# Patient Record
Sex: Female | Born: 1960 | Race: White | Hispanic: No | Marital: Single | Smoking: Never smoker
Health system: Southern US, Community
[De-identification: ages and names within clinical notes are randomized; demographics above are authoritative.]

## PROBLEM LIST (undated history)

## (undated) DIAGNOSIS — N83209 Unspecified ovarian cyst, unspecified side: Secondary | ICD-10-CM

## (undated) DIAGNOSIS — M797 Fibromyalgia: Secondary | ICD-10-CM

## (undated) DIAGNOSIS — F329 Major depressive disorder, single episode, unspecified: Secondary | ICD-10-CM

## (undated) DIAGNOSIS — N926 Irregular menstruation, unspecified: Secondary | ICD-10-CM

## (undated) DIAGNOSIS — R928 Other abnormal and inconclusive findings on diagnostic imaging of breast: Secondary | ICD-10-CM

## (undated) DIAGNOSIS — H9190 Unspecified hearing loss, unspecified ear: Secondary | ICD-10-CM

## (undated) DIAGNOSIS — G43909 Migraine, unspecified, not intractable, without status migrainosus: Secondary | ICD-10-CM

## (undated) DIAGNOSIS — N319 Neuromuscular dysfunction of bladder, unspecified: Secondary | ICD-10-CM

## (undated) DIAGNOSIS — N189 Chronic kidney disease, unspecified: Secondary | ICD-10-CM

## (undated) DIAGNOSIS — R12 Heartburn: Secondary | ICD-10-CM

## (undated) DIAGNOSIS — J40 Bronchitis, not specified as acute or chronic: Secondary | ICD-10-CM

## (undated) DIAGNOSIS — M8580 Other specified disorders of bone density and structure, unspecified site: Secondary | ICD-10-CM

## (undated) DIAGNOSIS — IMO0002 Reserved for concepts with insufficient information to code with codable children: Secondary | ICD-10-CM

## (undated) DIAGNOSIS — I519 Heart disease, unspecified: Secondary | ICD-10-CM

## (undated) DIAGNOSIS — F32A Depression, unspecified: Secondary | ICD-10-CM

## (undated) DIAGNOSIS — J449 Chronic obstructive pulmonary disease, unspecified: Secondary | ICD-10-CM

## (undated) DIAGNOSIS — M1990 Unspecified osteoarthritis, unspecified site: Secondary | ICD-10-CM

## (undated) DIAGNOSIS — F419 Anxiety disorder, unspecified: Secondary | ICD-10-CM

## (undated) DIAGNOSIS — I8390 Asymptomatic varicose veins of unspecified lower extremity: Secondary | ICD-10-CM

## (undated) DIAGNOSIS — K219 Gastro-esophageal reflux disease without esophagitis: Secondary | ICD-10-CM

## (undated) HISTORY — DX: Bronchitis, not specified as acute or chronic: J40

## (undated) HISTORY — DX: Unspecified hearing loss, unspecified ear: H91.90

## (undated) HISTORY — DX: Chronic kidney disease, unspecified: N18.9

## (undated) HISTORY — DX: Asymptomatic varicose veins of unspecified lower extremity: I83.90

## (undated) HISTORY — DX: Heart disease, unspecified: I51.9

## (undated) HISTORY — DX: Reserved for concepts with insufficient information to code with codable children: IMO0002

## (undated) HISTORY — DX: Gastro-esophageal reflux disease without esophagitis: K21.9

## (undated) HISTORY — PX: INNER EAR SURGERY: SHX679

## (undated) HISTORY — PX: TUBAL LIGATION: SHX77

## (undated) HISTORY — DX: Anxiety disorder, unspecified: F41.9

## (undated) HISTORY — DX: Irregular menstruation, unspecified: N92.6

## (undated) HISTORY — DX: Chronic obstructive pulmonary disease, unspecified: J44.9

## (undated) HISTORY — PX: NASAL SINUS SURGERY: SHX719

## (undated) HISTORY — DX: Unspecified osteoarthritis, unspecified site: M19.90

## (undated) HISTORY — DX: Unspecified ovarian cyst, unspecified side: N83.209

## (undated) HISTORY — DX: Major depressive disorder, single episode, unspecified: F32.9

## (undated) HISTORY — DX: Migraine, unspecified, not intractable, without status migrainosus: G43.909

## (undated) HISTORY — DX: Depression, unspecified: F32.A

## (undated) HISTORY — DX: Heartburn: R12

## (undated) HISTORY — DX: Other specified disorders of bone density and structure, unspecified site: M85.80

## (undated) HISTORY — PX: BRAIN MENINGIOMA EXCISION: SHX576

---

## 1998-11-02 ENCOUNTER — Other Ambulatory Visit: Admission: RE | Admit: 1998-11-02 | Discharge: 1998-11-02 | Payer: Self-pay | Admitting: Family Medicine

## 1999-03-22 ENCOUNTER — Other Ambulatory Visit: Admission: RE | Admit: 1999-03-22 | Discharge: 1999-03-22 | Payer: Self-pay | Admitting: Obstetrics and Gynecology

## 1999-03-24 ENCOUNTER — Ambulatory Visit (HOSPITAL_COMMUNITY): Admission: RE | Admit: 1999-03-24 | Discharge: 1999-03-24 | Payer: Self-pay | Admitting: Obstetrics and Gynecology

## 1999-03-24 ENCOUNTER — Encounter: Payer: Self-pay | Admitting: Obstetrics and Gynecology

## 2009-03-12 HISTORY — PX: CERVICAL FUSION: SHX112

## 2009-12-02 ENCOUNTER — Inpatient Hospital Stay (HOSPITAL_COMMUNITY): Admission: RE | Admit: 2009-12-02 | Discharge: 2009-12-03 | Payer: Self-pay | Admitting: Neurosurgery

## 2010-05-25 LAB — BASIC METABOLIC PANEL
BUN: 7 mg/dL (ref 6–23)
CO2: 29 mEq/L (ref 19–32)
Calcium: 9.6 mg/dL (ref 8.4–10.5)
Chloride: 105 mEq/L (ref 96–112)
Creatinine, Ser: 0.68 mg/dL (ref 0.4–1.2)
GFR calc Af Amer: >60 mL/min (ref >60)
GFR calc non Af Amer: >60 mL/min (ref >60)
Glucose, Bld: 86 mg/dL (ref 70–99)
Potassium: 4.7 mEq/L (ref 3.5–5.1)
Sodium: 139 mEq/L (ref 135–145)

## 2010-05-25 LAB — CBC
HCT: 40 % (ref 36.0–46.0)
Hemoglobin: 13.2 g/dL (ref 12.0–15.0)
MCH: 27.4 pg (ref 26.0–34.0)
MCHC: 33 g/dL (ref 30.0–36.0)
MCV: 83.2 fL (ref 78.0–100.0)
Platelets: 298 10*3/uL (ref 150–400)
RBC: 4.81 MIL/uL (ref 3.87–5.11)
RDW: 13.1 % (ref 11.5–15.5)
WBC: 18 10*3/uL — ABNORMAL HIGH (ref 4.0–10.5)

## 2010-05-25 LAB — SURGICAL PCR SCREEN
MRSA, PCR: NEGATIVE
Staphylococcus aureus: NEGATIVE

## 2011-02-05 ENCOUNTER — Encounter: Payer: Self-pay | Admitting: Vascular Surgery

## 2011-02-08 ENCOUNTER — Other Ambulatory Visit: Payer: Self-pay

## 2011-02-08 DIAGNOSIS — R600 Localized edema: Secondary | ICD-10-CM

## 2011-02-16 ENCOUNTER — Encounter: Payer: Self-pay | Admitting: Vascular Surgery

## 2011-02-19 ENCOUNTER — Ambulatory Visit (INDEPENDENT_AMBULATORY_CARE_PROVIDER_SITE_OTHER): Payer: BC Managed Care – PPO

## 2011-02-19 ENCOUNTER — Encounter: Payer: Self-pay | Admitting: Vascular Surgery

## 2011-02-19 ENCOUNTER — Ambulatory Visit (INDEPENDENT_AMBULATORY_CARE_PROVIDER_SITE_OTHER): Payer: BC Managed Care – PPO | Admitting: Vascular Surgery

## 2011-02-19 VITALS — BP 140/76 | HR 80 | Resp 20 | Ht 65.0 in | Wt 165.0 lb

## 2011-02-19 DIAGNOSIS — M79609 Pain in unspecified limb: Secondary | ICD-10-CM

## 2011-02-19 DIAGNOSIS — M7989 Other specified soft tissue disorders: Secondary | ICD-10-CM

## 2011-02-19 DIAGNOSIS — I83893 Varicose veins of bilateral lower extremities with other complications: Secondary | ICD-10-CM

## 2011-02-19 NOTE — Progress Notes (Signed)
Subjective:     Patient ID: Vanessa Mayer, female   DOB: 1960-05-12, 50 y.o.   MRN: [id number]  HPI this 50 year old female was referred for evaluation of possible venous insufficiency of the left leg the patient complains of swelling in the outside part of the left leg from the knee to the ankle as well as numbness this has been present for several months. She denies a history of DVT, thrombophlebitis, stasis ulcers, bleeding varicosities appear she is not wear elastic compression stockings nor elevate her legs at night. She states that sometimes the ankle swells as the day progresses. She was evaluated by I nerve specialist recently who said that the numbness in the leg was probably due to vascular causes. She does not ambulate long distances because of generalized fatigue but not leg pain.  Past Medical History  Diagnosis Date  . Chronic kidney disease   . Heartburn   . Depression   . Arthritis   . Anxiety   . Bronchitis   . Varicose veins   . Irregular menstrual cycle   . Chronic sinusitis   . Deaf     left ear  . Cyst     base of spine  . Ovarian cyst     History  Substance Use Topics  . Smoking status: Never Smoker   . Smokeless tobacco: Not on file  . Alcohol Use:     Family History  Problem Relation Age of Onset  . Diabetes Mother   . Hypertension Mother   . Cancer Father     lung  . Kidney disease Father     No Known Allergies  Current outpatient prescriptions:bethanechol (URECHOLINE) 25 MG tablet, Take 25 mg by mouth 3 (three) times daily.  , Disp: , Rfl: ;  cyclobenzaprine (FLEXERIL) 10 MG tablet, Take 10 mg by mouth 2 (two) times daily as needed.  , Disp: , Rfl: ;  DULoxetine (CYMBALTA) 60 MG capsule, Take 60 mg by mouth daily.  , Disp: , Rfl: ;  esomeprazole (NEXIUM) 40 MG capsule, Take 40 mg by mouth daily before breakfast.  , Disp: , Rfl:  loratadine (CLARITIN) 10 MG tablet, Take 10 mg by mouth daily.  , Disp: , Rfl: ;  meloxicam (MOBIC) 7.5 MG tablet, Take  7.5 mg by mouth 2 (two) times daily.  , Disp: , Rfl: ;  Multiple Vitamin (MULTIVITAMIN) capsule, Take 1 capsule by mouth daily.  , Disp: , Rfl: ;  omeprazole (PRILOSEC) 20 MG capsule, Take 20 mg by mouth daily.  , Disp: , Rfl: ;  pregabalin (LYRICA) 100 MG capsule, Take 100 mg by mouth 3 (three) times daily.  , Disp: , Rfl:  tiotropium (SPIRIVA) 18 MCG inhalation capsule, Place 18 mcg into inhaler and inhale daily.  , Disp: , Rfl: ;  medroxyPROGESTERone (PROVERA) 10 MG tablet, Take 10 mg by mouth daily.  , Disp: , Rfl:   BP 140/76  Pulse 80  Resp 20  Ht 5\' 5"  (1.651 m)  Wt 165 lb (74.844 kg)  BMI 27.46 kg/m2  Body mass index is 27.46 kg/(m^2).        Review of Systems she complains of dyspnea on exertion, leg pain with walking, swelling in legs, wheezing, tingling in the arms and legs, history of depression, but denies chest pain or any other specific symptoms in the complete review of    Objective:   Physical Exam blood pressure 140/76 heart rate 80 respirations 20 General she is a well-developed well-nourished  female in no apparent distress HEENT normal for age Lungs no rhonchi or wheezing Cardiovascular regular rhythm no murmurs carotid pulses 3+ no audible bruits Abdomen soft nontender with no masses Skin free of rashes Neurologic exam normal Lower extremity exam reveals 3+ femoral and dorsalis pedis pulses bilaterally. She does have multiple spider veins in the left leg anteriorly and posteriorly the distal thigh and proximal calf posteriorly. There are no bulging varicosities. There is no hyperpigmentation, ulceration, or significant edema in either lower extremity today.  Today I ordered a venous duplex exam which I reviewed and interpreted. There is significant reflux in the deep system of the left leg. There is no consistent reflux in the great saphenous or small saphenous systems of the left leg. The great saphenous vein is not of large caliber. There is one incompetent  perforating branch in the distal left leg.     Assessment:    deep venous insufficiency left leg with valvular incompetence but no evidence of superficial venous disease accounting for her symptomatology Doubt that the numbness in the lateral aspect of the left leg has any relationship with her vasculature    Plan:     #1 elevate foot of bed while sleeping at night 2-3 inches #2 short leg elastic compression stockings #3 further evaluation if numbness persists by orthopedic or neurologic type surgeon for possible nerve compression syndrome

## 2011-02-26 NOTE — Procedures (Unsigned)
LOWER EXTREMITY VENOUS REFLUX EXAM  INDICATION:  Left lower extremity edema and varicose veins.  EXAM:  Using color-flow imaging and pulse Doppler spectral analysis, the left common femoral, superficial femoral, popliteal, posterior tibial, greater and lesser saphenous veins are evaluated.  There is no evidence suggesting deep venous insufficiency in the left lower extremity.  The left saphenofemoral junction is competent. The left GSV is competent with the caliber as described below.  There are multiple incompetent pelvic branches in the left  groin. There is an incompetent, very superficial branch along the medial left thigh extending to the distal calf.  An incompetent perforator measuring 0.34 cm is also noted in the mid to distal calf.  There is a large, deep incompetent vein which extends to the distal posterior thigh, where it lies just beneath the fascia.  This vein may represent an incompetent vein of Giacomini.  Several incompetent branches are noted along the posterior knee at the saphenopopliteal junction.  GSV Diameter (used if found to be incompetent only)                                                   Right      Left Proximal Greater Saphenous Vein                   cm         0.54 cm Proximal-to-mid-thigh                             cm         0.44 cm Mid thigh                                         cm         0.34 cm  Distal thigh                                      cm         0.35 cm Knee                                              cm         0.35cm  BRANCH AT THE KNEE:  0.44 c.m.  IMPRESSION: 1. Left greater saphenous vein is competent. 2. The left greater saphenous vein is not tortuous. 3. The deep venous system is competent. 4. The left small saphenous vein is competent. 5. Incompetent pelvic branches in the left groin. 6. Incompetent superficial branch medial left lower extremity. 7. Probable incompetent left vein of  Giacomini.  ___________________________________________ Quita Skye. Hart Rochester, M.D.  CI/MEDQ  D:  02/19/2011  T:  02/19/2011  Job:  [id number]

## 2011-04-18 DIAGNOSIS — M79609 Pain in unspecified limb: Secondary | ICD-10-CM

## 2011-09-28 DIAGNOSIS — N938 Other specified abnormal uterine and vaginal bleeding: Secondary | ICD-10-CM | POA: Diagnosis present

## 2011-09-28 DIAGNOSIS — D251 Intramural leiomyoma of uterus: Secondary | ICD-10-CM | POA: Diagnosis present

## 2011-09-28 DIAGNOSIS — N8003 Adenomyosis of the uterus: Secondary | ICD-10-CM | POA: Diagnosis present

## 2011-10-23 ENCOUNTER — Encounter (HOSPITAL_COMMUNITY): Payer: Self-pay | Admitting: Pharmacist

## 2011-10-30 ENCOUNTER — Encounter (HOSPITAL_COMMUNITY)
Admission: RE | Admit: 2011-10-30 | Discharge: 2011-10-30 | Disposition: A | Discharge status: Home / Self Care | Payer: BC Managed Care – PPO | Source: Ambulatory Visit | Attending: Obstetrics & Gynecology | Admitting: Obstetrics & Gynecology

## 2011-10-30 ENCOUNTER — Encounter (HOSPITAL_COMMUNITY): Payer: Self-pay

## 2011-10-30 HISTORY — DX: Fibromyalgia: M79.7

## 2011-10-30 HISTORY — DX: Neuromuscular dysfunction of bladder, unspecified: N31.9

## 2011-10-30 LAB — CBC
HCT: 41.2 % (ref 36.0–46.0)
MCH: 26.5 pg (ref 26.0–34.0)
MCV: 81.4 fL (ref 78.0–100.0)
Platelets: 245 10*3/uL (ref 150–400)
RBC: 5.06 MIL/uL (ref 3.87–5.11)

## 2011-10-30 NOTE — Patient Instructions (Addendum)
20 LILLE KARIM  10/30/2011   Your procedure is scheduled on:  11/05/11  Enter through the Main Entrance of [facility] at 10 AM.  Pick up the phone at the desk and dial [phone].   Call this number if you have problems the morning of surgery: [phone]   Remember:   Do not eat food:After Midnight.  Do not drink clear liquids: After Midnight.  Take these medicines the morning of surgery with A SIP OF WATER: Nexium and Cymbalta   Do not wear jewelry, make-up or nail polish.  Do not wear lotions, powders, or perfumes. You may wear deodorant.  Do not shave 48 hours prior to surgery.  Do not bring valuables to the hospital.  Contacts, dentures or bridgework may not be worn into surgery.  Leave suitcase in the car. After surgery it may be brought to your room.  For patients admitted to the hospital, checkout time is 11:00 AM the day of discharge.   Patients discharged the day of surgery will not be allowed to drive home.  Name and phone number of your driver: NA  Special Instructions: CHG Shower Use Special Wash: 1/2 bottle night before surgery and 1/2 bottle morning of surgery.   Please read over the following fact sheets that you were given: MRSA Information

## 2011-11-05 ENCOUNTER — Encounter (HOSPITAL_COMMUNITY): Payer: Self-pay | Admitting: *Deleted

## 2011-11-05 ENCOUNTER — Ambulatory Visit (HOSPITAL_COMMUNITY): Payer: BC Managed Care – PPO | Admitting: Anesthesiology

## 2011-11-05 ENCOUNTER — Encounter (HOSPITAL_COMMUNITY): Payer: Self-pay

## 2011-11-05 ENCOUNTER — Encounter (HOSPITAL_COMMUNITY)
Admission: RE | Disposition: A | Discharge status: Home / Self Care | Payer: Self-pay | Source: Ambulatory Visit | Attending: Obstetrics & Gynecology

## 2011-11-05 ENCOUNTER — Encounter (HOSPITAL_COMMUNITY): Payer: Self-pay | Admitting: Anesthesiology

## 2011-11-05 ENCOUNTER — Ambulatory Visit (HOSPITAL_COMMUNITY)
Admission: RE | Admit: 2011-11-05 | Discharge: 2011-11-06 | Disposition: A | Discharge status: Home / Self Care | Payer: BC Managed Care – PPO | Source: Ambulatory Visit | Attending: Obstetrics & Gynecology | Admitting: Obstetrics & Gynecology

## 2011-11-05 DIAGNOSIS — N920 Excessive and frequent menstruation with regular cycle: Secondary | ICD-10-CM | POA: Insufficient documentation

## 2011-11-05 DIAGNOSIS — N8000 Endometriosis of the uterus, unspecified: Secondary | ICD-10-CM | POA: Insufficient documentation

## 2011-11-05 DIAGNOSIS — Z01812 Encounter for preprocedural laboratory examination: Secondary | ICD-10-CM | POA: Insufficient documentation

## 2011-11-05 DIAGNOSIS — N8003 Adenomyosis of the uterus: Secondary | ICD-10-CM | POA: Diagnosis present

## 2011-11-05 DIAGNOSIS — Z01818 Encounter for other preprocedural examination: Secondary | ICD-10-CM | POA: Insufficient documentation

## 2011-11-05 DIAGNOSIS — D251 Intramural leiomyoma of uterus: Secondary | ICD-10-CM | POA: Insufficient documentation

## 2011-11-05 DIAGNOSIS — N8300 Follicular cyst of ovary, unspecified side: Secondary | ICD-10-CM | POA: Insufficient documentation

## 2011-11-05 DIAGNOSIS — N938 Other specified abnormal uterine and vaginal bleeding: Secondary | ICD-10-CM | POA: Diagnosis present

## 2011-11-05 HISTORY — PX: LAPAROSCOPIC ASSISTED VAGINAL HYSTERECTOMY: SHX5398

## 2011-11-05 HISTORY — PX: SALPINGOOPHORECTOMY: SHX82

## 2011-11-05 SURGERY — HYSTERECTOMY, VAGINAL, LAPAROSCOPY-ASSISTED
Anesthesia: General | Site: Abdomen | Wound class: Clean Contaminated

## 2011-11-05 MED ORDER — ACETAMINOPHEN 325 MG PO TABS: 650.0000 mg | ORAL_TABLET | ORAL | Status: DC | PRN

## 2011-11-05 MED ORDER — BUPIVACAINE HCL (PF) 0.25 % IJ SOLN
INTRAMUSCULAR | Status: AC
  Filled 2011-11-05: qty 30

## 2011-11-05 MED ORDER — LACTATED RINGERS IV SOLN
INTRAVENOUS | Status: DC
  Administered 2011-11-05 (×3): via INTRAVENOUS

## 2011-11-05 MED ORDER — BETHANECHOL CHLORIDE 25 MG PO TABS
50.0000 mg | ORAL_TABLET | Freq: Three times a day (TID) | ORAL | Status: DC
  Administered 2011-11-05 – 2011-11-06 (×3): 50 mg via ORAL
  Filled 2011-11-05 (×3): qty 2

## 2011-11-05 MED ORDER — ALUM & MAG HYDROXIDE-SIMETH 200-200-20 MG/5ML PO SUSP: 30.0000 mL | ORAL | Status: DC | PRN

## 2011-11-05 MED ORDER — KETOROLAC TROMETHAMINE 30 MG/ML IJ SOLN
30.0000 mg | Freq: Four times a day (QID) | INTRAMUSCULAR | Status: DC
  Administered 2011-11-05 – 2011-11-06 (×3): 30 mg via INTRAVENOUS
  Filled 2011-11-05 (×2): qty 1

## 2011-11-05 MED ORDER — SIMETHICONE 80 MG PO CHEW: 80.0000 mg | CHEWABLE_TABLET | Freq: Four times a day (QID) | ORAL | Status: DC | PRN

## 2011-11-05 MED ORDER — MIDAZOLAM HCL 5 MG/5ML IJ SOLN
INTRAMUSCULAR | Status: DC | PRN
  Administered 2011-11-05: 2 mg via INTRAVENOUS

## 2011-11-05 MED ORDER — KETOROLAC TROMETHAMINE 30 MG/ML IJ SOLN
INTRAMUSCULAR | Status: DC | PRN
  Administered 2011-11-05: 30 mg via INTRAVENOUS

## 2011-11-05 MED ORDER — LIDOCAINE HCL (CARDIAC) 20 MG/ML IV SOLN
INTRAVENOUS | Status: AC
Start: 2011-11-05 — End: 2011-11-05
  Filled 2011-11-05: qty 5

## 2011-11-05 MED ORDER — MENTHOL 3 MG MT LOZG: 1.0000 | LOZENGE | OROMUCOSAL | Status: DC | PRN

## 2011-11-05 MED ORDER — PROMETHAZINE HCL 25 MG/ML IJ SOLN: 12.5000 mg | Freq: Four times a day (QID) | INTRAMUSCULAR | Status: DC | PRN

## 2011-11-05 MED ORDER — TEMAZEPAM 15 MG PO CAPS
15.0000 mg | ORAL_CAPSULE | Freq: Every evening | ORAL | Status: DC | PRN
  Administered 2011-11-06: 15 mg via ORAL
  Filled 2011-11-05: qty 2

## 2011-11-05 MED ORDER — FENTANYL CITRATE 0.05 MG/ML IJ SOLN
INTRAMUSCULAR | Status: DC | PRN
  Administered 2011-11-05: 100 ug via INTRAVENOUS
  Administered 2011-11-05 (×3): 50 ug via INTRAVENOUS

## 2011-11-05 MED ORDER — PROPOFOL 10 MG/ML IV EMUL
INTRAVENOUS | Status: AC
  Filled 2011-11-05: qty 20

## 2011-11-05 MED ORDER — LACTATED RINGERS IR SOLN
Status: DC | PRN
  Administered 2011-11-05: 3000 mL

## 2011-11-05 MED ORDER — GLYCOPYRROLATE 0.2 MG/ML IJ SOLN
INTRAMUSCULAR | Status: DC | PRN
  Administered 2011-11-05: 0.6 mg via INTRAVENOUS

## 2011-11-05 MED ORDER — TIOTROPIUM BROMIDE MONOHYDRATE 18 MCG IN CAPS
18.0000 ug | ORAL_CAPSULE | Freq: Every day | RESPIRATORY_TRACT | Status: DC | PRN
  Filled 2011-11-05: qty 5

## 2011-11-05 MED ORDER — SODIUM CHLORIDE 0.9 % IJ SOLN
INTRAMUSCULAR | Status: DC | PRN
  Administered 2011-11-05: 10 mL

## 2011-11-05 MED ORDER — PROPOFOL 10 MG/ML IV EMUL
INTRAVENOUS | Status: DC | PRN
  Administered 2011-11-05: 150 mg via INTRAVENOUS

## 2011-11-05 MED ORDER — DEXTROSE-NACL 5-0.45 % IV SOLN
INTRAVENOUS | Status: DC
  Administered 2011-11-05: 21:00:00 via INTRAVENOUS

## 2011-11-05 MED ORDER — ROPIVACAINE HCL 5 MG/ML IJ SOLN
INTRAMUSCULAR | Status: DC | PRN
  Administered 2011-11-05: 60 mL

## 2011-11-05 MED ORDER — MIDAZOLAM HCL 2 MG/2ML IJ SOLN
INTRAMUSCULAR | Status: AC
  Filled 2011-11-05: qty 2

## 2011-11-05 MED ORDER — CEFAZOLIN SODIUM-DEXTROSE 2-3 GM-% IV SOLR
INTRAVENOUS | Status: AC
  Filled 2011-11-05: qty 50

## 2011-11-05 MED ORDER — NALOXONE HCL 0.4 MG/ML IJ SOLN: 0.4000 mg | INTRAMUSCULAR | Status: DC | PRN

## 2011-11-05 MED ORDER — NEOSTIGMINE METHYLSULFATE 1 MG/ML IJ SOLN
INTRAMUSCULAR | Status: DC | PRN
  Administered 2011-11-05: 4 mg via INTRAVENOUS

## 2011-11-05 MED ORDER — DIPHENHYDRAMINE HCL 50 MG/ML IJ SOLN: 12.5000 mg | Freq: Four times a day (QID) | INTRAMUSCULAR | Status: DC | PRN

## 2011-11-05 MED ORDER — DEXAMETHASONE SODIUM PHOSPHATE 10 MG/ML IJ SOLN
INTRAMUSCULAR | Status: AC
  Filled 2011-11-05: qty 1

## 2011-11-05 MED ORDER — CEFAZOLIN SODIUM-DEXTROSE 2-3 GM-% IV SOLR
2.0000 g | INTRAVENOUS | Status: AC
  Administered 2011-11-05: 2 g via INTRAVENOUS

## 2011-11-05 MED ORDER — LIDOCAINE-EPINEPHRINE 1 %-1:100000 IJ SOLN
INTRAMUSCULAR | Status: DC | PRN
  Administered 2011-11-05: 10 mL

## 2011-11-05 MED ORDER — HYDROMORPHONE HCL PF 1 MG/ML IJ SOLN: 0.2500 mg | INTRAMUSCULAR | Status: DC | PRN

## 2011-11-05 MED ORDER — OXYCODONE-ACETAMINOPHEN 5-325 MG PO TABS
1.0000 | ORAL_TABLET | ORAL | Status: DC | PRN
  Administered 2011-11-06 (×2): 1 via ORAL
  Filled 2011-11-05 (×2): qty 1

## 2011-11-05 MED ORDER — NEOSTIGMINE METHYLSULFATE 1 MG/ML IJ SOLN
INTRAMUSCULAR | Status: AC
  Filled 2011-11-05: qty 10

## 2011-11-05 MED ORDER — ARIPIPRAZOLE 5 MG PO TABS
5.0000 mg | ORAL_TABLET | Freq: Every evening | ORAL | Status: DC
  Administered 2011-11-05: 5 mg via ORAL
  Filled 2011-11-05: qty 1

## 2011-11-05 MED ORDER — KETOROLAC TROMETHAMINE 30 MG/ML IJ SOLN
30.0000 mg | Freq: Four times a day (QID) | INTRAMUSCULAR | Status: DC
  Filled 2011-11-05: qty 1

## 2011-11-05 MED ORDER — FENTANYL CITRATE 0.05 MG/ML IJ SOLN
INTRAMUSCULAR | Status: AC
  Filled 2011-11-05: qty 5

## 2011-11-05 MED ORDER — GLYCOPYRROLATE 0.2 MG/ML IJ SOLN
INTRAMUSCULAR | Status: AC
  Filled 2011-11-05: qty 1

## 2011-11-05 MED ORDER — DULOXETINE HCL 60 MG PO CPEP
60.0000 mg | ORAL_CAPSULE | Freq: Every day | ORAL | Status: DC
  Administered 2011-11-06: 60 mg via ORAL
  Filled 2011-11-05: qty 1

## 2011-11-05 MED ORDER — DEXAMETHASONE SODIUM PHOSPHATE 4 MG/ML IJ SOLN
INTRAMUSCULAR | Status: DC | PRN
  Administered 2011-11-05: 10 mg via INTRAVENOUS

## 2011-11-05 MED ORDER — KETOROLAC TROMETHAMINE 30 MG/ML IJ SOLN
INTRAMUSCULAR | Status: AC
  Filled 2011-11-05: qty 1

## 2011-11-05 MED ORDER — HYDROMORPHONE 0.3 MG/ML IV SOLN
INTRAVENOUS | Status: DC
  Administered 2011-11-05: 16:00:00 via INTRAVENOUS
  Filled 2011-11-05: qty 25

## 2011-11-05 MED ORDER — PREGABALIN 50 MG PO CAPS
100.0000 mg | ORAL_CAPSULE | Freq: Three times a day (TID) | ORAL | Status: DC
  Administered 2011-11-05 (×2): 100 mg via ORAL
  Filled 2011-11-05 (×2): qty 2

## 2011-11-05 MED ORDER — LIDOCAINE HCL (CARDIAC) 20 MG/ML IV SOLN
INTRAVENOUS | Status: DC | PRN
  Administered 2011-11-05: 60 mg via INTRAVENOUS

## 2011-11-05 MED ORDER — GLYCOPYRROLATE 0.2 MG/ML IJ SOLN
INTRAMUSCULAR | Status: AC
  Filled 2011-11-05: qty 2

## 2011-11-05 MED ORDER — ONDANSETRON HCL 4 MG/2ML IJ SOLN
4.0000 mg | Freq: Four times a day (QID) | INTRAMUSCULAR | Status: DC | PRN
  Administered 2011-11-05: 4 mg via INTRAVENOUS
  Filled 2011-11-05: qty 2

## 2011-11-05 MED ORDER — ONDANSETRON HCL 4 MG/2ML IJ SOLN
INTRAMUSCULAR | Status: AC
  Filled 2011-11-05: qty 2

## 2011-11-05 MED ORDER — PANTOPRAZOLE SODIUM 40 MG IV SOLR
40.0000 mg | Freq: Every day | INTRAVENOUS | Status: DC
  Administered 2011-11-05: 40 mg via INTRAVENOUS
  Filled 2011-11-05: qty 40

## 2011-11-05 MED ORDER — ROCURONIUM BROMIDE 100 MG/10ML IV SOLN
INTRAVENOUS | Status: DC | PRN
  Administered 2011-11-05: 50 mg via INTRAVENOUS

## 2011-11-05 MED ORDER — SODIUM CHLORIDE 0.9 % IJ SOLN: 9.0000 mL | INTRAMUSCULAR | Status: DC | PRN

## 2011-11-05 MED ORDER — ONDANSETRON HCL 4 MG/2ML IJ SOLN
INTRAMUSCULAR | Status: DC | PRN
  Administered 2011-11-05: 4 mg via INTRAVENOUS

## 2011-11-05 MED ORDER — ROCURONIUM BROMIDE 50 MG/5ML IV SOLN
INTRAVENOUS | Status: AC
  Filled 2011-11-05: qty 1

## 2011-11-05 MED ORDER — BUPIVACAINE HCL (PF) 0.25 % IJ SOLN
INTRAMUSCULAR | Status: DC | PRN
  Administered 2011-11-05: 10 mL

## 2011-11-05 MED ORDER — DIPHENHYDRAMINE HCL 12.5 MG/5ML PO ELIX: 12.5000 mg | ORAL_SOLUTION | Freq: Four times a day (QID) | ORAL | Status: DC | PRN

## 2011-11-05 MED ORDER — ROPIVACAINE HCL 5 MG/ML IJ SOLN
INTRAMUSCULAR | Status: AC
Start: 2011-11-05 — End: 2011-11-05
  Filled 2011-11-05: qty 30

## 2011-11-05 SURGICAL SUPPLY — 53 items
BENZOIN TINCTURE PRP APPL 2/3 (GAUZE/BANDAGES/DRESSINGS) IMPLANT
CABLE HIGH FREQUENCY MONO STRZ (ELECTRODE) ×3 IMPLANT
CHLORAPREP W/TINT 26ML (MISCELLANEOUS) ×3 IMPLANT
CLOTH BEACON ORANGE TIMEOUT ST (SAFETY) ×6 IMPLANT
CONT PATH 16OZ SNAP LID 3702 (MISCELLANEOUS) ×3 IMPLANT
COVER TABLE BACK 60X90 (DRAPES) ×3 IMPLANT
DECANTER SPIKE VIAL GLASS SM (MISCELLANEOUS) ×12 IMPLANT
DERMABOND ADVANCED (GAUZE/BANDAGES/DRESSINGS) ×1
DERMABOND ADVANCED .7 DNX12 (GAUZE/BANDAGES/DRESSINGS) ×2 IMPLANT
ELECT REM PT RETURN 9FT ADLT (ELECTROSURGICAL) ×3
ELECTRODE REM PT RTRN 9FT ADLT (ELECTROSURGICAL) ×2 IMPLANT
EVACUATOR SMOKE 8.L (FILTER) ×3 IMPLANT
FORCEPS CUTTING 33CM 5MM (CUTTING FORCEPS) ×3 IMPLANT
GLOVE BIOGEL PI IND STRL 7.0 (GLOVE) ×4 IMPLANT
GLOVE BIOGEL PI INDICATOR 7.0 (GLOVE) ×2
GLOVE ECLIPSE 6.5 STRL STRAW (GLOVE) ×9 IMPLANT
GOWN PREVENTION PLUS LG XLONG (DISPOSABLE) ×18 IMPLANT
NEEDLE INSUFFLATION 14GA 120MM (NEEDLE) ×3 IMPLANT
NS IRRIG 1000ML POUR BTL (IV SOLUTION) ×3 IMPLANT
OCCLUDER COLPOPNEUMO (BALLOONS) IMPLANT
PACK LAVH (CUSTOM PROCEDURE TRAY) ×3 IMPLANT
PROTECTOR NERVE ULNAR (MISCELLANEOUS) ×6 IMPLANT
SCISSORS LAP 5X35 DISP (ENDOMECHANICALS) ×6 IMPLANT
SET IRRIG TUBING LAPAROSCOPIC (IRRIGATION / IRRIGATOR) ×3 IMPLANT
SOLUTION ELECTROLUBE (MISCELLANEOUS) ×3 IMPLANT
SPATULA 33CM PLASMA (CUTTING FORCEPS) IMPLANT
STRIP CLOSURE SKIN 1/4X4 (GAUZE/BANDAGES/DRESSINGS) ×3 IMPLANT
SUT VIC AB 0 CT1 18XCR BRD8 (SUTURE) ×6 IMPLANT
SUT VIC AB 0 CT1 27 (SUTURE)
SUT VIC AB 0 CT1 27XBRD ANBCTR (SUTURE) IMPLANT
SUT VIC AB 0 CT1 36 (SUTURE) ×6 IMPLANT
SUT VIC AB 0 CT1 8-18 (SUTURE) ×3
SUT VIC AB 2-0 SH 27 (SUTURE) ×1
SUT VIC AB 2-0 SH 27XBRD (SUTURE) ×2 IMPLANT
SUT VIC AB 3-0 PS2 18 (SUTURE) ×1
SUT VIC AB 3-0 PS2 18XBRD (SUTURE) ×2 IMPLANT
SUT VICRYL 0 TIES 12 18 (SUTURE) ×3 IMPLANT
SUT VICRYL 0 UR6 27IN ABS (SUTURE) ×6 IMPLANT
SYR 30ML LL (SYRINGE) ×3 IMPLANT
SYR 50ML LL SCALE MARK (SYRINGE) ×6 IMPLANT
SYR BULB IRRIGATION 50ML (SYRINGE) ×3 IMPLANT
SYRINGE 10CC LL (SYRINGE) IMPLANT
TIP UTERINE 5.1X6CM LAV DISP (MISCELLANEOUS) IMPLANT
TIP UTERINE 6.7X10CM GRN DISP (MISCELLANEOUS) IMPLANT
TIP UTERINE 6.7X6CM WHT DISP (MISCELLANEOUS) IMPLANT
TIP UTERINE 6.7X8CM BLUE DISP (MISCELLANEOUS) ×3 IMPLANT
TOWEL OR 17X24 6PK STRL BLUE (TOWEL DISPOSABLE) ×6 IMPLANT
TRAY FOLEY CATH 14FR (SET/KITS/TRAYS/PACK) ×3 IMPLANT
TROCAR BALLN 12MMX100 BLUNT (TROCAR) IMPLANT
TROCAR XCEL NON-BLD 11X100MML (ENDOMECHANICALS) ×3 IMPLANT
TROCAR XCEL NON-BLD 5MMX100MML (ENDOMECHANICALS) ×6 IMPLANT
WARMER LAPAROSCOPE (MISCELLANEOUS) ×3 IMPLANT
WATER STERILE IRR 1000ML POUR (IV SOLUTION) IMPLANT

## 2011-11-05 NOTE — Anesthesia Preprocedure Evaluation (Signed)

## 2011-11-05 NOTE — Anesthesia Postprocedure Evaluation (Signed)
  Anesthesia Post-op Note  Patient: Vanessa Mayer  Procedure(s) Performed: Procedure(s) (LRB): LAPAROSCOPIC ASSISTED VAGINAL HYSTERECTOMY (N/A) SALPINGO OOPHERECTOMY (Bilateral)  Patient is awake and responsive. Pain and nausea are reasonably well controlled. Vital signs are stable and clinically acceptable. Oxygen saturation is clinically acceptable. There are no apparent anesthetic complications at this time. Patient is ready for discharge.

## 2011-11-05 NOTE — Progress Notes (Signed)
Day of Surgery Procedure(s) (LRB): LAPAROSCOPIC ASSISTED VAGINAL HYSTERECTOMY (N/A) SALPINGO OOPHERECTOMY (Bilateral)  Subjective: Patient reports nausea that has resolved.  She has good pain control.  She is hungry.  Objective: I have reviewed patient's vital signs, intake and output and medications.  General: alert and cooperative Resp: clear to auscultation bilaterally Cardio: regular rate and rhythm, S1, S2 normal, no murmur, click, rub or gallop GI: normal findings: soft, mildly tender, occ BS, no distension Extremities: extremities normal, atraumatic, no cyanosis or edema Vaginal Bleeding: minimal  Assessment: s/p Procedure(s) (LRB): LAPAROSCOPIC ASSISTED VAGINAL HYSTERECTOMY (N/A) SALPINGO OOPHERECTOMY (Bilateral): stable and progressing well  Plan: Advance diet Encourage ambulation D/C foley catheter  LOS: 0 days    Vanessa Mayer Vanessa Mayer 11/05/2011, 5:43 PM

## 2011-11-05 NOTE — Op Note (Signed)
11/05/2011  1:37 PM  PATIENT:  Vanessa Mayer  51 y.o. female G37P3   PRE-OPERATIVE DIAGNOSIS:  menorrhagia, fibroids  POST-OPERATIVE DIAGNOSIS:  menorrhagia, fibroids  PROCEDURE:  Procedure(s): LAPAROSCOPIC ASSISTED VAGINAL HYSTERECTOMY SALPINGO OOPHERECTOMY   SURGEON:  Verla Bryngelson [name]  ASSISTANTS: ROMINE, CYNTHIA   ANESTHESIA:   general  ESTIMATED BLOOD LOSS: 150cc  BLOOD ADMINISTERED:none   FLUIDS: 1600cc LR  UOP: 60cc  SPECIMEN:  Uterus, cervix, bilateral tubes and ovary  DISPOSITION OF SPECIMEN:  PATHOLOGY  FINDINGS: globular uterus, normal tubes and ovaries  DESCRIPTION OF OPERATION:  Patient taken the operating room. She is placed in the supine position. General endotracheal anesthesia was administered by the anesthesia staff without difficulty. A glide scope was used because of the history of prior neck surgery.  Legs are positioned in the low lithotomy position in [place] stirrups. Patient has functioning sequential compression devices on her legs bilaterally. Her arms were tucked by her side. There is padding at her elbows and her wrists. A running IV was functioning properly. Time out was performed. Chlor prep was used to prep the abdomen and Betadine was used to prep the perineum, inner thighs, and vagina x3. Patient was draped in a normal standard fashion her legs are lifted to the high lithotomy position.  Attention was turned the vagina. A heavy weighted speculum was placed in the posterior vaginal fornix. The anterior lip the cervix was visualized and grasped with single-tooth tenaculum. The uterus sounded to 9 cm. The cervix was dilated up to #21 [facility] dilator. A RUMI uterine manipulator was obtained. A #8 disposable tip is attached to this. A small KOH ring was also attached to the RUMI uterine manipulator. This was advanced into the endometrial cavity with 10 cc balloon was inflated with normal saline. There was a good fit of the KOH ring around the cervix.  A Foley catheter was inserted the bladder. This is placed to straight drain. The other instruments were removed from the vagina. Legs are positioned back to the low lithotomy position and attention was turned to the abdomen.   Quarter percent Marcaine was used anesthetize the umbilicus. 10 mm skin incision was made with #11 blade. The subcutaneous fat and tissue was dissected. A Veress needle was obtained. A stopcock was open. The abdomen was elevated. The Veress needle was inserted under direct insertion to the abdomen. A syringe of normal saline was attached to the needle. An aspiration was performed. No blood or fluid was noted. Fluid injected easily into the needle. A second aspiration was performed. No blood fluid or saline was noted. Finally fluid dripped into the needle without difficulty. Then CO2 was attached under low flows of CO2 gas a pneumoperitoneum was achieved. And 3 and half liters of gas was in the abdomen the needle was removed. A 10 mm Optiview non-bladed trocar port were obtained. These were attached to the laparoscope. Under direct visualization these are passed to the abdominal wall layers. Intra-abdominal placed was noted. The abdominal wall was transilluminated and locations for right left lower quadrant port noted. Skin was anesthetized quarter percent Marcaine. The skin was nicked with a #11 blade. 5 mm non-bladed trochars and ports were passed into the abdomen under direct visualization of the laparoscope.  The patient was placed in Trendelenburg positioning. The ovaries were freely mobile. She did want the tubes and ovaries removed. The uterus is globular consistent with fibroids. The ureters were noted bilaterally to be peristalsing. The uterus placed on stretch the left  and the ovary and fallopian tube were also placed on stretch. Using a gyrus instrument, the IP ligament on the right side was serially clamped cauterized and incised. The sound indicator was used to ensure the  adequate cautery was present before cutting to any pedicles. The right round ligament was serially clamped cauterized and incised. Then using endoscopic scissor with monopolar cautery attached, the inferior leaf of the broad ligament was opened. The anterior peritoneum was taken down to begin the bladder flap dissection and the posterior peritoneum was taken down to the level of the uterosacral ligament. The pubovesicocervical fascia was dissected until the bladder was below the level of the KOH ring. Then the uterus placed on stretch to the opposite side in a similar fashion the left IP ligament was serially clamped cauterized and incised. The left round ligament was serially clamped cauterized and incised. The inferiorly portion of the broad ligament was was opened.  The posterior peritoneum was taken down to the level of the uterosacral ligament and anterior peritoneum incised to meet the prior dissection on the right side. The pubovesicocervical fascia on the left side was also dissected below the level of the KOH ring. At this point there was excellent hemostasis present. The instruments were removed. The pneumoperitoneum was relieved. The patient was taken out of Trendelenburg positioning and the legs were lifted to the high lithotomy position.  A heavy weighted speculum was placed placed in the vagina again. The cervix was grasped with Christella Hartigan tenaculum. 10 cc of 1% Xylocaine mixed one-to-one with epinephrine (1:100,000 units) was used. Then the posterior vaginal mucosa and peritoneum were incised sharply. A figure-of-eight suture of #0 Vicryl was used to attach posterior vaginal mucosa and posterior peritoneum. The bonnano speculum was placed in this incision. Uterosacral ligaments on each side were clamped with curved Haney clips. Heaney suture of #0 Vicryl was used suture these pedicles. Then using a curved Deaver retractor, the anterior aspect of the cervix was visualized. Using a knife, the cervix was  incised from the 9:00 to 2:00 position. A curved Mayo scissors were used to dissect in the pubovesicocervical plane. Then using an open Ray-Te, the operators index finger was used to pass and the plane between the bladder and the cervix. The dissection done above via laparoscopy was met. A curved Deaver retractor is placed in this incision. Then alternating on the right and left of the cervix, the cardinal ligaments were serially clamped cauterized and suture-ligated with #0 Vicryl. This was continued at the cervix until the uterine arteries were visualized. The uterine arteries on each side were clamped cut and suture-ligated with #0 Vicryl stitch. A second stitch was placed across these vascular pedicles to ensure excellent hemostasis. At this point the fundus of the uterus could be delivered to the vagina and there small pedicle tissue on each side which was grasped with Heaney clamps. The tissue was transected and the specimen was delivered to the vagina. Using a free-tie of #0 Vicryl is final pedicles were suture-ligated. The pedicles were all visualized and no bleeding was noted.  Then starting at the 2:00 position on the vagina, the anterior vaginal mucosa and anterior peritoneum were incorporated into a running figure-of-eight suture which was carried clockwise around the cervix ending at about the 10:00 position. Irrigation was used and again no bleeding was noted. At this point the cuff was closed with 3 figure-of-eight sutures of #0 Vicryl. There is no bleeding noted from the vagina. All instruments were removed removed and  attention was turned back to the abdomen.  The pneumoperitoneum was reachieved with CO2 gas. The laparoscope was used to visualize the pelvis and the pedicles. There is no bleeding noted along any pedicles. The ureters were noted to be peristalsing in the pelvis bilaterally. Then with the laparoscope visualizing the cuff, pneumoperitoneum was slowly released to ensure that there  was no bleeding when all pressure was down. There is a small bleeder that was noted above the uterine artery sidewall pedicle. This was well above the level of the ureter as well. It was grasped with a gyrus and then bipolar cautery was used to achieve excellent hemostasis. The Ne[place]hat suction irrigator was used to irrigate the pelvis no bleeding was noted. The pneumoperitoneum was reachieved and then again under direct visualization with the laparoscope, the pelvis was visualized.  No bleeding was noted.  Finally, the pneumoperitoneum was relieved one final time.  No bleeding was noted. This point the procedure was ended. Patient was taken out of Trendelenburg positioning. The inferior ports were removed under direct visualization of the laparoscope. The laparoscope was removed and the pneumoperitoneum was relieved from the midline port. The CRNA give patient several good deep breaths to try and get any remaining gas out of the abdomen. The midline port was removed.  At the umbilicus the fascia was closed with figure-of-eight suture of #0 Vicryl. The skin was closed with subcuticular stitch of 3-0 Vicryl. The 2 inferior port sites were hemostatic. They were closed with Dermabond and the umbilical incision was also closed with Dermabond. At this point the procedure was ended. The patient's trachea removed. Sponge, laps, needle, and instrument counts were correct x2. She did well during the procedure. Her legs were moved back in the supine position. She was extubated after being awakened from anesthesia. She was taken to recovery in stable condition.   COUNTS:  YES  PLAN OF CARE: Transfer to PACU

## 2011-11-05 NOTE — H&P (Signed)
Vanessa Mayer is an 51 y.o. female G3P3 MWF here for LAVH/BSO due to menorrhagia, adenomyosis on ultrasound, and uterine fibroids.  She cannot tolerate estrogen containing OCPs due to migraine headaches and has failed Micronor use.  She has been counseled about other options including IUD use and endometrial ablation but has opted for definitive management.  All risks and benefit is were discussed in my office and documented in my office chart.  Pertinent Gynecological History: Menses: heavy with clotting Bleeding: menorrhagia Contraception: tubal ligation DES exposure: denies Blood transfusions: none Sexually transmitted diseases: no past history Previous GYN Procedures: none  Last mammogram: normal Date: 4/12 Last pap: normal Date: 3/13 OB History: G3, P3   Menstrual History: Menarche age: 21 Patient's last menstrual period was 10/03/2011.    Past Medical History  Diagnosis Date  . Heartburn   . Depression   . Arthritis   . Anxiety   . Bronchitis   . Varicose veins   . Irregular menstrual cycle   . Chronic sinusitis   . Deaf     left ear  . Cyst     base of spine  . Ovarian cyst   . Chronic kidney disease     seen by [facility] [id number]  . Bladder dysfunction     see comments Kidney disease  . Fibromyalgia     Past Surgical History  Procedure Date  . Inner ear surgery     x2  . Cervical fusion 2011    plate placed  . Brain meningioma excision     2001  . Nasal sinus surgery     >18yrs ago    Family History  Problem Relation Age of Onset  . Diabetes Mother   . Hypertension Mother   . Cancer Father     lung  . Kidney disease Father     Social History:  reports that she has never smoked. She does not have any smokeless tobacco history on file. She reports that she does not drink alcohol or use illicit drugs.  Allergies: No Known Allergies  Prescriptions prior to admission  Medication Sig Dispense Refill  . ARIPiprazole (ABILIFY) 5 MG tablet  Take 5 mg by mouth every evening.      . bethanechol (URECHOLINE) 25 MG tablet Take 50 mg by mouth 3 (three) times daily.       . DULoxetine (CYMBALTA) 60 MG capsule Take 60 mg by mouth daily.        Marland Kitchen esomeprazole (NEXIUM) 40 MG capsule Take 40 mg by mouth daily before breakfast.        . loratadine (CLARITIN) 10 MG tablet Take 10 mg by mouth daily.        . medroxyPROGESTERone (PROVERA) 10 MG tablet Take 10 mg by mouth daily.        . meloxicam (MOBIC) 7.5 MG tablet Take 7.5 mg by mouth 2 (two) times daily.        . Multiple Vitamin (MULTIVITAMIN) capsule Take 1 capsule by mouth daily.        Marland Kitchen omeprazole (PRILOSEC) 20 MG capsule Take 20 mg by mouth at bedtime.       . pregabalin (LYRICA) 100 MG capsule Take 100 mg by mouth 3 (three) times daily.        . cyclobenzaprine (FLEXERIL) 10 MG tablet Take 10 mg by mouth 2 (two) times daily as needed. For muscle spasms      . tiotropium (SPIRIVA) 18 MCG inhalation capsule Place 18  mcg into inhaler and inhale daily as needed. For allergy symptoms        Review of Systems  Constitutional: Negative for fever and chills.  Eyes: Negative for blurred vision.  Respiratory: Negative for cough.   Cardiovascular: Negative for chest pain and palpitations.  Gastrointestinal: Negative for heartburn, nausea and vomiting.  Genitourinary: Negative for dysuria and urgency.  Musculoskeletal: Negative for myalgias.  Skin: Negative for rash.  Neurological: Negative for dizziness and headaches.  Endo/Heme/Allergies: Does not bruise/bleed easily.  Psychiatric/Behavioral: Negative for depression.    Blood pressure 131/71, pulse 70, temperature 98.4 F (36.9 C), temperature source Oral, resp. rate 16, height 5\' 5"  (1.651 m), weight 81.647 kg (180 lb), last menstrual period 10/03/2011, SpO2 100.00%. Physical Exam  Constitutional: She is oriented to person, place, and time. She appears well-developed and well-nourished.  HENT:  Head: Normocephalic and atraumatic.    Neck: Normal range of motion. Neck supple.  Cardiovascular: Normal rate, regular rhythm and normal heart sounds.   Respiratory: Effort normal and breath sounds normal.  GI: Soft. Bowel sounds are normal.  Musculoskeletal: Normal range of motion.  Neurological: She is oriented to person, place, and time.  Skin: Skin is warm and dry.  Psychiatric: She has a normal mood and affect.    Results for orders placed during the hospital encounter of 11/05/11 (from the past 24 hour(s))  PREGNANCY, URINE     Status: Normal   Collection Time   11/05/11 10:24 AM      Component Value Range   Preg Test, Ur NEGATIVE  NEGATIVE    No results found.  Assessment/Plan: 51 yo G3P3 MWF with history of menorrhagia, adenomyosis on ultrasound, and uterine fibroids here for LAVH/BSO.  Risks and benefits have been discussed.  Patient is ready to proceed.  Valentina Shaggy [name] 11/05/2011, 11:04 AM

## 2011-11-05 NOTE — Transfer of Care (Signed)
Immediate Anesthesia Transfer of Care Note  Patient: Vanessa Mayer  Procedure(s) Performed: Procedure(s) (LRB): LAPAROSCOPIC ASSISTED VAGINAL HYSTERECTOMY (N/A) SALPINGO OOPHERECTOMY (Bilateral)  Patient Location: PACU  Anesthesia Type: General  Level of Consciousness: awake, alert  and oriented  Airway & Oxygen Therapy: Patient Spontanous Breathing and Patient connected to face mask oxygen  Post-op Assessment: Report given to PACU RN and Post -op Vital signs reviewed and stable  Post vital signs: Reviewed and stable  Complications: No apparent anesthesia complications

## 2011-11-05 NOTE — Anesthesia Procedure Notes (Signed)
Procedure Name: Intubation Date/Time: 11/05/2011 11:34 AM Performed by: Norva Pavlov Pre-anesthesia Checklist: Patient identified, Emergency Drugs available, Suction available and Patient being monitored Patient Re-evaluated:Patient Re-evaluated prior to inductionOxygen Delivery Method: Circle System Utilized Preoxygenation: Pre-oxygenation with 100% oxygen Intubation Type: IV induction Ventilation: Mask ventilation without difficulty Laryngoscope Size: 3 Grade View: Grade I Tube type: Oral Tube size: 7.0 mm Number of attempts: 1 Airway Equipment and Method: stylet,  oral airway and Video-laryngoscopy Placement Confirmation: ETT inserted through vocal cords under direct vision,  positive ETCO2 and breath sounds checked- equal and bilateral Secured at: 20 cm Tube secured with: Tape Dental Injury: Teeth and Oropharynx as per pre-operative assessment  Comments: Glidescope due to history of neck surgery and neck pain.

## 2011-11-06 ENCOUNTER — Encounter (HOSPITAL_COMMUNITY): Payer: Self-pay | Admitting: Obstetrics & Gynecology

## 2011-11-06 LAB — CBC
HCT: 34.1 % — ABNORMAL LOW (ref 36.0–46.0)
Hemoglobin: 11.1 g/dL — ABNORMAL LOW (ref 12.0–15.0)
MCH: 26.6 pg (ref 26.0–34.0)
MCHC: 32.6 g/dL (ref 30.0–36.0)
RBC: 4.18 MIL/uL (ref 3.87–5.11)

## 2011-11-06 LAB — BASIC METABOLIC PANEL
BUN: 13 mg/dL (ref 6–23)
CO2: 25 mEq/L (ref 19–32)
Glucose, Bld: 135 mg/dL — ABNORMAL HIGH (ref 70–99)
Potassium: 3.8 mEq/L (ref 3.5–5.1)
Sodium: 138 mEq/L (ref 135–145)

## 2011-11-06 MED ORDER — OXYCODONE-ACETAMINOPHEN 5-325 MG PO TABS: 1.0000 | ORAL_TABLET | ORAL | Status: AC | PRN

## 2011-11-06 NOTE — Discharge Summary (Signed)
Physician Discharge Summary  Patient ID: MAKAYLYN SINYARD MRN: [id number] DOB/AGE: [birth year] 51 y.o.  Admit date: 11/05/2011 Discharge date: 11/06/2011  Admission Diagnoses:DUB, fibroids, adenomyosis  Discharge Diagnoses:  Active Problems:  * No active hospital problems. *    Discharged Condition: good  Hospital Course: Patient admitted through same day surgery.  LAVH/BSO was performed.  EBL ~150cc.  Surgery went without event.  Catheter was left in at end of case.  Patient transferred to PACU and then to third floor for the remainder of her hospitalization.  Patient has stable vital signs and was afebrile trough her post operative course.  She was prescribed a dilaudid PCA but has nausea each time she gave herself a dose.  Eventually, with improvement in bowel sounds, she was transitioned to a regular diet and oral pain medications.  This caused the nausea to go away completely.  Her foley cathter was removed in the evening of POD#0 after her UOP improved.  She was able to void without difficulty.  Patient, in the am of POD#1, was comfortable, had no nausea, was ambulating well, was voiding without difficulty and was taking all meds by mouth.  Discharge home was felt appropriate.   Findings:  Operative findings included enlarged uterus with fibroids but otherwise normal pelvis and upper abdomen.  Consults: None  Significant Diagnostic Studies: labs: post op hb 11.1, electrolytes normal  Treatments: surgery: LAVH/BSO  Discharge Exam: Blood pressure 112/60, pulse 72, temperature 97.8 F (36.6 C), temperature source Oral, resp. rate 18, height 5\' 5"  (1.651 m), weight 81.647 kg (180 lb), last menstrual period 10/03/2011, SpO2 97.00%. General appearance: alert and cooperative Resp: clear to auscultation bilaterally Cardio: regular rate and rhythm, S1, S2 normal, no murmur, click, rub or gallop GI: soft, non-tender; bowel sounds normal; no masses,  no organomegaly Extremities: extremities  normal, atraumatic, no cyanosis or edema Incision/Wound:clean/dry/intact  Disposition: stable for discharge home with family   Medication List  As of 11/06/2011  8:43 AM   STOP taking these medications         medroxyPROGESTERone 10 MG tablet         TAKE these medications         ARIPiprazole 5 MG tablet   Commonly known as: ABILIFY   Take 5 mg by mouth every evening.      bethanechol 25 MG tablet   Commonly known as: URECHOLINE   Take 50 mg by mouth 3 (three) times daily.      cyclobenzaprine 10 MG tablet   Commonly known as: FLEXERIL   Take 10 mg by mouth 2 (two) times daily as needed. For muscle spasms      DULoxetine 60 MG capsule   Commonly known as: CYMBALTA   Take 60 mg by mouth daily.      esomeprazole 40 MG capsule   Commonly known as: NEXIUM   Take 40 mg by mouth daily before breakfast.      loratadine 10 MG tablet   Commonly known as: CLARITIN   Take 10 mg by mouth daily.      meloxicam 7.5 MG tablet   Commonly known as: MOBIC   Take 7.5 mg by mouth 2 (two) times daily.      multivitamin capsule   Take 1 capsule by mouth daily.      omeprazole 20 MG capsule   Commonly known as: PRILOSEC   Take 20 mg by mouth at bedtime.      oxyCODONE-acetaminophen 5-325 MG per tablet  Commonly known as: PERCOCET/ROXICET   Take 1-2 tablets by mouth every 4 (four) hours as needed (moderate to severe pain (when tolerating fluids)).      pregabalin 100 MG capsule   Commonly known as: LYRICA   Take 100 mg by mouth 3 (three) times daily.      tiotropium 18 MCG inhalation capsule   Commonly known as: SPIRIVA   Place 18 mcg into inhaler and inhale daily as needed. For allergy symptoms           Follow-up Information    Follow up with Doneen Ollinger, Marda Stalker, MD in 1 week. (patient already has appointment date and time)    Contact information:   [address] [place] [address] [phone]           Signed: Annamaria Boots 11/06/2011, 8:43 AM

## 2011-11-06 NOTE — Progress Notes (Signed)
Pt discharged to home with sister.  Condition stable.  Pt to car via wheelchair with Dolphus Jenny, NT.  No equipment for home ordered at discharge.

## 2011-11-06 NOTE — Progress Notes (Signed)
1 Day Post-Op Procedure(s) (LRB): LAPAROSCOPIC ASSISTED VAGINAL HYSTERECTOMY (N/A) SALPINGO OOPHERECTOMY (Bilateral)  Subjective: Patient reports tolerating PO, + flatus and no problems voiding.  She has good pain control and no nausea. Objective: I have reviewed patient's vital signs, intake and output, medications and labs.  General: alert and cooperative Resp: clear to auscultation bilaterally Cardio: regular rate and rhythm, S1, S2 normal, no murmur, click, rub or gallop GI: soft, non-tender; bowel sounds normal; no masses,  no organomegaly and incision: clean, dry and intact Extremities: extremities normal, atraumatic, no cyanosis or edema Vaginal Bleeding: minimal  Assessment: s/p Procedure(s) (LRB): LAPAROSCOPIC ASSISTED VAGINAL HYSTERECTOMY (N/A) SALPINGO OOPHERECTOMY (Bilateral): stable and progressing well  Plan: Discharge home  LOS: 1 day    Vanessa Mayer Vanessa Mayer 11/06/2011, 8:39 AM

## 2011-11-06 NOTE — Addendum Note (Signed)
Addendum  created 11/06/11 0800 by Elbert Ewings, CRNA   Modules edited:Notes Section

## 2011-11-06 NOTE — Anesthesia Postprocedure Evaluation (Signed)
  Anesthesia Post-op Note  Patient: Vanessa Mayer  Procedure(s) Performed: Procedure(s) (LRB): LAPAROSCOPIC ASSISTED VAGINAL HYSTERECTOMY (N/A) SALPINGO OOPHERECTOMY (Bilateral)  Patient Location: PACU and Women's Unit  Anesthesia Type: General  Level of Consciousness: awake, alert  and oriented  Airway and Oxygen Therapy: Patient Spontanous Breathing  Post-op Pain: mild  Post-op Assessment: Patient's Cardiovascular Status Stable, Respiratory Function Stable, No signs of Nausea or vomiting, Adequate PO intake and Pain level controlled  Post-op Vital Signs: stable  Complications: No apparent anesthesia complications

## 2013-03-13 ENCOUNTER — Telehealth: Payer: Self-pay | Admitting: Obstetrics & Gynecology

## 2013-03-13 NOTE — Telephone Encounter (Signed)
S/w she has 4-5 pills left of Estradiol Last Mammogram was 02/15/12 patient has not had a recent one Says she will call me back when scheduled and aware that [place] is out till Tuesday okay to wait until then.

## 2013-03-13 NOTE — Telephone Encounter (Signed)
Patient is asking for a refill of Estradiol 1mg  has aex appt 03/31/13  [facility] [address] [place] [phone]

## 2013-03-16 ENCOUNTER — Telehealth: Payer: Self-pay | Admitting: Obstetrics & Gynecology

## 2013-03-16 MED ORDER — ESTRADIOL 1 MG PO TABS: 1.0000 mg | ORAL_TABLET | Freq: Every day | ORAL | Status: DC

## 2013-03-16 NOTE — Telephone Encounter (Signed)
Pt is calling to get a refill for estradiol. She has an appointment schedule for January 13th for her mammogram.[facility] [address] in [place]

## 2013-03-16 NOTE — Telephone Encounter (Addendum)
Patient called again needing refill. Mammogram scheduled for 03/24/13  Estradiol #30/0 [facility] [facility]) LM on patient's vm of the importance of getting mammogram and that rx has been sent.  Patient is aware.

## 2013-03-16 NOTE — Telephone Encounter (Signed)
See phone note rx sent

## 2013-03-16 NOTE — Telephone Encounter (Signed)
Pt is calling to get a refill  on estradiol she has an appointment for her mammogram on Tuesday the 13th of January. Please

## 2013-03-30 ENCOUNTER — Encounter: Payer: Self-pay | Admitting: Obstetrics & Gynecology

## 2013-03-31 ENCOUNTER — Ambulatory Visit (INDEPENDENT_AMBULATORY_CARE_PROVIDER_SITE_OTHER): Payer: BC Managed Care – PPO | Admitting: Obstetrics & Gynecology

## 2013-03-31 ENCOUNTER — Encounter: Payer: Self-pay | Admitting: Obstetrics & Gynecology

## 2013-03-31 VITALS — BP 130/83 | HR 72 | Resp 16 | Ht 65.0 in | Wt 183.2 lb

## 2013-03-31 DIAGNOSIS — Z01419 Encounter for gynecological examination (general) (routine) without abnormal findings: Secondary | ICD-10-CM

## 2013-03-31 MED ORDER — ESTRADIOL 1 MG PO TABS: 1.0000 mg | ORAL_TABLET | Freq: Every day | ORAL | Status: AC

## 2013-03-31 NOTE — Patient Instructions (Signed)

## 2013-03-31 NOTE — Progress Notes (Signed)
53 y.o. G3P3 SingleCaucasianF here for annual exam.  Doing well.  No vaginal bleeding.  No pain with intercourse.  Seeing Dr. Jannette Fogo next month--PCP.  Will have colonoscopy scheduled through him.    Patient's last menstrual period was 02/24/2012.          Sexually active: yes  The current method of family planning is none.    Exercising: yes  Work Smoker:  no  Health Maintenance: Pap:  06/01/2011 - Normal History of abnormal Pap:  no MMG:  03/25/2013, Grade 2 breast density Colonoscopy: n/a BMD: 2011 TDaP: 06/01/2011 Screening Labs: next month, Hb today: done by PCP, Urine today: done by PCP   reports that she has never smoked. She has never used smokeless tobacco. She reports that she does not drink alcohol or use illicit drugs.  Past Medical History  Diagnosis Date  . Heartburn   . Depression   . Arthritis   . Anxiety   . Bronchitis   . Varicose veins   . Irregular menstrual cycle   . Chronic sinusitis   . Deaf     left ear  . Cyst     base of spine  . Ovarian cyst   . Chronic kidney disease     seen by [facility] q2mos  . Bladder dysfunction     see comments Kidney disease  . Fibromyalgia   . Migraine   . Osteopenia   . Acid reflux   . COPD (chronic obstructive pulmonary disease)   . Heart problem     negative work up  . Degenerative disc disease     Past Surgical History  Procedure Laterality Date  . Inner ear surgery      x2  . Cervical fusion  2011    plate placed  . Brain meningioma excision      2001  . Nasal sinus surgery      >30yrs ago  . Laparoscopic assisted vaginal hysterectomy  11/05/2011    Procedure: LAPAROSCOPIC ASSISTED VAGINAL HYSTERECTOMY;  Surgeon: Lyman Speller, MD;  Location: [facility];  Service: Gynecology;  Laterality: N/A;  . Salpingoophorectomy  11/05/2011    Procedure: SALPINGO OOPHERECTOMY;  Surgeon: Lyman Speller, MD;  Location: [facility];  Service: Gynecology;  Laterality: Bilateral;  . Tubal ligation       Current Outpatient Prescriptions  Medication Sig Dispense Refill  . amphetamine-dextroamphetamine (ADDERALL) 10 MG tablet Take 10 mg by mouth daily.      Marland Kitchen atorvastatin (LIPITOR) 10 MG tablet Take 10 mg by mouth daily.      . bethanechol (URECHOLINE) 25 MG tablet Take 25 mg by mouth 3 (three) times daily.       . cyclobenzaprine (FLEXERIL) 10 MG tablet Take 10 mg by mouth 2 (two) times daily as needed. For muscle spasms      . DULoxetine (CYMBALTA) 60 MG capsule Take 60 mg by mouth daily.        Marland Kitchen esomeprazole (NEXIUM) 40 MG capsule Take 40 mg by mouth daily before breakfast.        . estradiol (ESTRACE) 1 MG tablet Take 1 tablet (1 mg total) by mouth daily.  30 tablet  0  . loratadine (CLARITIN) 10 MG tablet Take 10 mg by mouth daily.        . meloxicam (MOBIC) 7.5 MG tablet Take 7.5 mg by mouth 2 (two) times daily.        . Multiple Vitamin (MULTIVITAMIN) capsule Take 1 capsule by mouth  daily.        . omeprazole (PRILOSEC) 20 MG capsule Take 20 mg by mouth at bedtime.       . pregabalin (LYRICA) 100 MG capsule Take 100 mg by mouth 3 (three) times daily.        . traMADol (ULTRAM) 50 MG tablet Take 50 mg by mouth 2 (two) times daily.      Marland Kitchen tiotropium (SPIRIVA) 18 MCG inhalation capsule Place 18 mcg into inhaler and inhale daily as needed. For allergy symptoms       No current facility-administered medications for this visit.    Family History  Problem Relation Age of Onset  . Diabetes Mother   . Hypertension Mother   . Cancer Father     lung  . Kidney disease Father   . Cancer Mother     kidney cancer  . Diabetes Father   . Diabetes Paternal Grandmother   . Breast cancer Paternal Grandmother   . Hypertension Father     ROS:  Pertinent items are noted in HPI.  Otherwise, a comprehensive ROS was negative.  Exam:   BP 130/83  Pulse 72  Resp 16  Ht 5\' 5"  (1.651 m)  Wt 183 lb 3.2 oz (83.099 kg)  BMI 30.49 kg/m2  LMP 02/24/2012  Weight change: +3lbs   Height: 5\' 5"   (165.1 cm)  Ht Readings from Last 3 Encounters:  03/31/13 5\' 5"  (1.651 m)  11/05/11 5\' 5"  (1.651 m)  11/05/11 5\' 5"  (1.651 m)    General appearance: alert, cooperative and appears stated age Head: Normocephalic, without obvious abnormality, atraumatic Neck: no adenopathy, supple, symmetrical, trachea midline and thyroid normal to inspection and palpation Lungs: clear to auscultation bilaterally Breasts: normal appearance, no masses or tenderness Heart: regular rate and rhythm Abdomen: soft, non-tender; bowel sounds normal; no masses,  no organomegaly Extremities: extremities normal, atraumatic, no cyanosis or edema Skin: Skin color, texture, turgor normal. No rashes or lesions Lymph nodes: Cervical, supraclavicular, and axillary nodes normal. No abnormal inguinal nodes palpated Neurologic: Grossly normal   Pelvic: External genitalia:  no lesions              Urethra:  normal appearing urethra with no masses, tenderness or lesions              Bartholins and Skenes: normal                 Vagina: normal appearing vagina with normal color and discharge, no lesions              Cervix: absent              Pap taken: no Bimanual Exam:  Uterus:  uterus absent              Adnexa: no mass, fullness, tenderness               Rectovaginal: Confirms               Anus:  normal sphincter tone, no lesions  A:  Well Woman with normal exam H/O LAVH/BSO due to fibroids Fibromyalgia, on medication On HRT  P:   Mammogram yearly. pap smear not indicated. Estradiol 1.0mg  rx to pharmacy. Pt seeing PCP next month.  May end up seeing him yearly and not me/our practice.   return annually or prn  An After Visit Summary was printed and given to the patient.

## 2013-04-13 ENCOUNTER — Other Ambulatory Visit: Payer: Self-pay | Admitting: Obstetrics & Gynecology

## 2013-10-07 ENCOUNTER — Other Ambulatory Visit: Payer: Self-pay | Admitting: Otolaryngology

## 2013-11-03 ENCOUNTER — Ambulatory Visit (HOSPITAL_BASED_OUTPATIENT_CLINIC_OR_DEPARTMENT_OTHER): Admit: 2013-11-03 | Payer: Self-pay | Admitting: Otolaryngology

## 2013-11-03 ENCOUNTER — Encounter (HOSPITAL_BASED_OUTPATIENT_CLINIC_OR_DEPARTMENT_OTHER): Payer: Self-pay

## 2013-11-03 SURGERY — STAPEDECTOMY
Anesthesia: General | Site: Ear | Laterality: Left

## 2014-01-11 ENCOUNTER — Encounter: Payer: Self-pay | Admitting: Obstetrics & Gynecology

## 2015-12-21 ENCOUNTER — Ambulatory Visit (INDEPENDENT_AMBULATORY_CARE_PROVIDER_SITE_OTHER): Payer: BLUE CROSS/BLUE SHIELD | Admitting: Sports Medicine

## 2015-12-21 ENCOUNTER — Encounter: Payer: Self-pay | Admitting: Sports Medicine

## 2015-12-21 DIAGNOSIS — M79675 Pain in left toe(s): Secondary | ICD-10-CM

## 2015-12-21 DIAGNOSIS — L600 Ingrowing nail: Secondary | ICD-10-CM

## 2015-12-21 MED ORDER — AMOXICILLIN-POT CLAVULANATE 875-125 MG PO TABS: 1.0000 | ORAL_TABLET | Freq: Two times a day (BID) | ORAL | 1 refills | Status: DC

## 2015-12-21 NOTE — Progress Notes (Signed)
Subjective: Vanessa Mayer is a 55 y.o. female patient presents to office today complaining of a painful incurvated, red, medial nail border of the 1st toe on the left foot. This has been present for a few weeks; admits to history of ingrowns that she digs out herself. Patient denies fever/chills/nausea/vomitting/any other related constitutional symptoms at this time.  Patient Active Problem List   Diagnosis Date Noted  . Varicose veins of lower extremities with other complications [id number]    Current Outpatient Prescriptions on File Prior to Visit  Medication Sig Dispense Refill  . amphetamine-dextroamphetamine (ADDERALL) 10 MG tablet Take 10 mg by mouth daily.    Marland Kitchen atorvastatin (LIPITOR) 10 MG tablet Take 10 mg by mouth daily.    . bethanechol (URECHOLINE) 25 MG tablet Take 25 mg by mouth 3 (three) times daily.     . DULoxetine (CYMBALTA) 60 MG capsule Take 60 mg by mouth daily.      Marland Kitchen esomeprazole (NEXIUM) 40 MG capsule Take 40 mg by mouth daily before breakfast.      . estradiol (ESTRACE) 1 MG tablet Take 1 tablet (1 mg total) by mouth daily. 90 tablet 4  . loratadine (CLARITIN) 10 MG tablet Take 10 mg by mouth daily.      . meloxicam (MOBIC) 7.5 MG tablet Take 7.5 mg by mouth 2 (two) times daily.      . Multiple Vitamin (MULTIVITAMIN) capsule Take 1 capsule by mouth daily.      Marland Kitchen omeprazole (PRILOSEC) 20 MG capsule Take 20 mg by mouth at bedtime.     . pregabalin (LYRICA) 100 MG capsule Take 100 mg by mouth 3 (three) times daily.      . traMADol (ULTRAM) 50 MG tablet Take 50 mg by mouth 2 (two) times daily.     No current facility-administered medications on file prior to visit.     Allergies  Allergen Reactions  . Other     Strong pain medication-nausea    Objective:  There were no vitals filed for this visit.  General: Well developed, nourished, in no acute distress, alert and oriented x3   Dermatology: Skin is warm, dry and supple bilateral. Left hallux nail appears  to be severely incurvated with hyperkeratosis formation at the distal aspects of the medial and lateral nail border. (+) Focal Erythema. (-) Edema. (+) serosanguous drainage present. The remaining nails appear unremarkable at this time. There are no open sores, lesions or other signs of infection  present.  Vascular: Dorsalis Pedis artery and Posterior Tibial artery pedal pulses are 2/4 bilateral with immedate capillary fill time. Pedal hair growth present. No lower extremity edema.   Neruologic: Grossly intact via light touch bilateral.  Musculoskeletal: Tenderness to palpation of the left hallux medial nail fold. Muscular strength within normal limits in all groups bilateral.   Assesement and Plan: Problem List Items Addressed This Visit    None    Visit Diagnoses    Ingrown nail    -  Primary   L Hallux medial margin   Relevant Medications   amoxicillin-clavulanate (AUGMENTIN) 875-125 MG tablet   Toe pain, left       Relevant Medications   amoxicillin-clavulanate (AUGMENTIN) 875-125 MG tablet      -Discussed treatment alternatives and plan of care; Explained permanent/temporary nail avulsion and post procedure course to patient. Patient opt for PNA Left hallux medial margin  - After a verbal consent, injected 3 ml of a 50:50 mixture of 2% plain lidocaine and 0.5%  plain marcaine in a normal hallux block fashion. Next, a betadine prep was performed. Anesthesia was tested and found to be appropriate.  The offending Left hallux medial nail border was then incised from the hyponychium to the epinychium. The offending nail border was removed and cleared from the field. The area was curretted for any remaining nail or spicules. Phenol application performed and the area was then flushed with alcohol and dressed with antibiotic cream and a dry sterile dressing. -Patient was instructed to leave the dressing intact for today and begin soaking in a weak solution of betadine and water tomorrow.  Patient was instructed to soak for 15 minutes each day and apply neosporin and a gauze or bandaid dressing each day. -Rx Augmentin for preventative measures -Patient was instructed to monitor the toe for signs of infection and return to office if toe becomes red, hot or swollen. -Advised ice, elevation, and tylenol or motrin if needed for pain.  -Patient is to return in 1-2 weeks for follow up care/nail check or sooner if problems arise.  Landis Martins, DPM

## 2015-12-21 NOTE — Patient Instructions (Signed)

## 2016-01-04 ENCOUNTER — Ambulatory Visit (INDEPENDENT_AMBULATORY_CARE_PROVIDER_SITE_OTHER): Payer: BLUE CROSS/BLUE SHIELD | Admitting: Sports Medicine

## 2016-01-04 ENCOUNTER — Encounter: Payer: Self-pay | Admitting: Sports Medicine

## 2016-01-04 DIAGNOSIS — Z9889 Other specified postprocedural states: Secondary | ICD-10-CM

## 2016-01-04 DIAGNOSIS — M79675 Pain in left toe(s): Secondary | ICD-10-CM

## 2016-01-04 NOTE — Progress Notes (Signed)
Subjective: Vanessa Mayer is a 55 y.o. female patient returns to office today for follow up evaluation after having Left Hallux medial permanent nail avulsion performed on 12-21-15. Patient has been soaking using betadine and applying topical antibiotic covered with bandaid daily. Completed Augmentin with no problems. Patient denies fever/chills/nausea/vomitting/any other related constitutional symptoms at this time.  Patient Active Problem List   Diagnosis Date Noted  . Varicose veins of lower extremities with other complications [id number]    Current Outpatient Prescriptions on File Prior to Visit  Medication Sig Dispense Refill  . amoxicillin-clavulanate (AUGMENTIN) 875-125 MG tablet Take 1 tablet by mouth 2 (two) times daily. 28 tablet 1  . amphetamine-dextroamphetamine (ADDERALL) 10 MG tablet Take 10 mg by mouth daily.    Marland Kitchen atorvastatin (LIPITOR) 10 MG tablet Take 10 mg by mouth daily.    . bethanechol (URECHOLINE) 25 MG tablet Take 25 mg by mouth 3 (three) times daily.     . DULoxetine (CYMBALTA) 60 MG capsule Take 60 mg by mouth daily.      Marland Kitchen esomeprazole (NEXIUM) 40 MG capsule Take 40 mg by mouth daily before breakfast.      . estradiol (ESTRACE) 1 MG tablet Take 1 tablet (1 mg total) by mouth daily. 90 tablet 4  . loratadine (CLARITIN) 10 MG tablet Take 10 mg by mouth daily.      . meloxicam (MOBIC) 7.5 MG tablet Take 7.5 mg by mouth 2 (two) times daily.      . Multiple Vitamin (MULTIVITAMIN) capsule Take 1 capsule by mouth daily.      Marland Kitchen omeprazole (PRILOSEC) 20 MG capsule Take 20 mg by mouth at bedtime.     . pregabalin (LYRICA) 100 MG capsule Take 100 mg by mouth 3 (three) times daily.      . traMADol (ULTRAM) 50 MG tablet Take 50 mg by mouth 2 (two) times daily.     No current facility-administered medications on file prior to visit.     Allergies  Allergen Reactions  . Other     Strong pain medication-nausea    Objective:  General: Well developed, nourished, in no  acute distress, alert and oriented x3   Dermatology: Skin is warm, dry and supple bilateral. Left hallux medial nail bed appears to be clean, dry, with mild granular tissue and surrounding eschar/scab. (-) Erythema. (-) Edema. (-) serosanguous drainage present. The remaining nails appear unremarkable at this time. There are no other lesions or other signs of infection present.  Neurovascular status: Intact. No lower extremity swelling; No pain with calf compression bilateral.  Musculoskeletal: Decreased tenderness to palpation of the Left hallux medial nail fold. Muscular strength within normal limits bilateral.   Assesement and Plan: Problem List Items Addressed This Visit    None    Visit Diagnoses    S/P nail surgery    -  Primary   Toe pain, left         -Examined patient  -Cleansed left hallux medial nail fold and gently scrubbed with peroxide and q-tip/curetted away eschar at site and applied antibiotic cream covered with bandaid.  -Discussed plan of care with patient. -Patient to now begin soaking in a weak solution of Epsom salt and warm water. Patient was instructed to soak for 15-20 minutes each day until the toe appears normal and there is no drainage, redness, tenderness, or swelling at the procedure site, and apply neosporin and a gauze or bandaid dressing each day as needed. May leave open to air  at night. -Educated patient on long term care after nail surgery. -Patient was instructed to monitor the toe for reoccurrence and signs of infection; Patient advised to return to office or go to ER if toe becomes red, hot or swollen. -Patient is to return as needed or sooner if problems arise.  Landis Martins, DPM

## 2016-10-10 ENCOUNTER — Other Ambulatory Visit: Payer: Self-pay | Admitting: Internal Medicine

## 2016-10-10 DIAGNOSIS — N6489 Other specified disorders of breast: Secondary | ICD-10-CM

## 2016-10-17 ENCOUNTER — Ambulatory Visit
Admission: RE | Admit: 2016-10-17 | Discharge: 2016-10-17 | Disposition: A | Discharge status: Home / Self Care | Payer: Self-pay | Source: Ambulatory Visit | Attending: Internal Medicine | Admitting: Internal Medicine

## 2016-10-17 DIAGNOSIS — N6489 Other specified disorders of breast: Secondary | ICD-10-CM

## 2016-11-15 ENCOUNTER — Other Ambulatory Visit: Payer: Self-pay | Admitting: General Surgery

## 2016-11-15 ENCOUNTER — Encounter (HOSPITAL_BASED_OUTPATIENT_CLINIC_OR_DEPARTMENT_OTHER): Payer: Self-pay | Admitting: *Deleted

## 2016-11-15 DIAGNOSIS — R928 Other abnormal and inconclusive findings on diagnostic imaging of breast: Secondary | ICD-10-CM

## 2016-12-08 NOTE — H&P (Signed)
Vanessa Mayer Location: [facility] Patient #: [id number] DOB: 04-25-1960 Single / Language: Vanessa Mayer / Race: White Female        History of Present Illness       The patient is a 56 year old female who presents with a breast mass. This is a 56 year old female from [facility], referred by Dr. Rosana Hoes at the [facility] for evaluation of complex sclerosing lesion left breast. Dr. Valaria Good in [place]. Also sees Dr. Celedonio Miyamoto at [facility] in [employer].     No prior breast problems other than skin rashes which are now resolved. Gets annual mammograms. Recent mammogram show a focal area of architectural distortion in the upper outer quadrant of the left breast. Image guided biopsy shows complex sclerosing lesion, fibrocystic change and usual ductal hyperplasia. She was referred for excision. We had a long talk. Exam shows no mass. I told her that she probably does not have cancer but there is a small 4-9% chance that she could have cancer at this time. She clearly was to have this area excised.     Comorbidities reveal recurrent problems with skin rashes. Right lung nodules followed in [place] thought to be benign. Pneumonia. Hypertension. Fibromyalgia. Degenerative disc disease. Vaginal hysterectomy Father died of lung cancer. Mother died of kidney cancer. Paternal grandmother had breast cancer Social history reveals she has a boyfriend and lives in [place]. She lives at home but does not have a job. Denies alcohol or tobacco. Has 3 children.     She'll be scheduled for left breast lumpectomy with radioactive seed localization. I discussed the indications, details, techniques, numerous risk of the surgery with her. She is aware of the risk of bleeding, infection, reoperation of cancer, cosmetic deformity, nerve damage with chronic pain or numbness, and other unforeseen problems. She understands these issues well. All of her questions were  answered. She agrees with this plan.     Allergies  No Known Allergies   Medication History  Amphetamine-Dextroamphet ER (10MG  Capsule ER 24HR, Oral) Active. Atorvastatin Calcium (10MG  Tablet, Oral) Active. TraMADol HCl (50MG  Tablet, Oral) Active. Esomeprazole Magnesium (40MG  Capsule DR, Oral) Active. DULoxetine HCl (60MG  Capsule DR Part, Oral) Active. Claritin (10MG  Tablet, Oral) Active. Meloxicam (7.5MG  Tablet, Oral) Active. Pregabalin (100MG  Capsule, Oral) Active. Omeprazole (20MG  Capsule DR, Oral) Active. Estrogens Conj Synthetic A (Oral) Specific strength unknown - Active. Medications Reconciled  Vitals  Weight: 173.6 lb Height: 65in Body Surface Area: 1.86 m Body Mass Index: 28.89 kg/m  Temp.: 97.31F  Pulse: 85 (Regular)  BP: 126/80 (Sitting, Left Arm, Standard)    Physical Exam  General Mental Status-Alert. General Appearance-Consistent with stated age. Hydration-Well hydrated. Voice-Normal.  Head and Neck Head-normocephalic, atraumatic with no lesions or palpable masses. Trachea-midline. Thyroid Gland Characteristics - normal size and consistency.  Eye Eyeball - Bilateral-Extraocular movements intact. Sclera/Conjunctiva - Bilateral-No scleral icterus.  Chest and Lung Exam Chest and lung exam reveals -quiet, even and easy respiratory effort with no use of accessory muscles and on auscultation, normal breath sounds, no adventitious sounds and normal vocal resonance. Inspection Chest Wall - Normal. Back - normal.  Breast Note: Breasts are medium size. Tiny puncture wound 12 o'clock position left breast. No hematoma or ecchymoses. No palpable mass in either breast. No other skin changes. No axillary adenopathy on either side.   Cardiovascular Cardiovascular examination reveals -normal heart sounds, regular rate and rhythm with no murmurs and normal pedal pulses bilaterally.  Abdomen Inspection Inspection  of the  abdomen reveals - No Hernias. Skin - Scar - no surgical scars. Palpation/Percussion Palpation and Percussion of the abdomen reveal - Soft, Non Tender, No Rebound tenderness, No Rigidity (guarding) and No hepatosplenomegaly. Auscultation Auscultation of the abdomen reveals - Bowel sounds normal.  Neurologic Neurologic evaluation reveals -alert and oriented x 3 with no impairment of recent or remote memory. Mental Status-Normal.  Musculoskeletal Normal Exam - Left-Upper Extremity Strength Normal and Lower Extremity Strength Normal. Normal Exam - Right-Upper Extremity Strength Normal and Lower Extremity Strength Normal.  Lymphatic Head & Neck  General Head & Neck Lymphatics: Bilateral - Description - Normal. Axillary  General Axillary Region: Bilateral - Description - Normal. Tenderness - Non Tender. Femoral & Inguinal  Generalized Femoral & Inguinal Lymphatics: Bilateral - Description - Normal. Tenderness - Non Tender.    Assessment & Plan  ABNORMAL MAMMOGRAM OF LEFT BREAST (R92.8)   Your recent imaging studies and biopsy showed a complex sclerosing lesion in the upper outer quadrant of the left breast. This is a small spot Most likely you do not have cancer There is a 4-9% chance that there could be cancer present right female Excision is recommended, and you agree  You'll be scheduled for left breast lumpectomy with radioactive seed localization We have discussed the indications, techniques, and risks of the surgery in detail  HYPERTENSION, ESSENTIAL (I10) FIBROMYALGIA (M79.7) DEGENERATIVE DISC DISEASE, LUMBAR (M51.36)    Edsel Petrin. Dalbert Batman, M.D., [facility] General and Minimally invasive Surgery Breast and Colorectal Surgery Office:   [phone] Pager:   [phone]

## 2016-12-10 ENCOUNTER — Ambulatory Visit
Admission: RE | Admit: 2016-12-10 | Discharge: 2016-12-10 | Disposition: A | Discharge status: Home / Self Care | Payer: BLUE CROSS/BLUE SHIELD | Source: Ambulatory Visit | Attending: General Surgery | Admitting: General Surgery

## 2016-12-10 DIAGNOSIS — R928 Other abnormal and inconclusive findings on diagnostic imaging of breast: Secondary | ICD-10-CM

## 2016-12-11 ENCOUNTER — Ambulatory Visit (HOSPITAL_BASED_OUTPATIENT_CLINIC_OR_DEPARTMENT_OTHER)
Admission: RE | Admit: 2016-12-11 | Discharge: 2016-12-11 | Disposition: A | Discharge status: Home / Self Care | Payer: BLUE CROSS/BLUE SHIELD | Source: Ambulatory Visit | Attending: General Surgery | Admitting: General Surgery

## 2016-12-11 ENCOUNTER — Encounter (HOSPITAL_BASED_OUTPATIENT_CLINIC_OR_DEPARTMENT_OTHER): Payer: Self-pay | Admitting: *Deleted

## 2016-12-11 ENCOUNTER — Ambulatory Visit (HOSPITAL_BASED_OUTPATIENT_CLINIC_OR_DEPARTMENT_OTHER): Payer: BLUE CROSS/BLUE SHIELD | Admitting: Certified Registered"

## 2016-12-11 ENCOUNTER — Encounter (HOSPITAL_BASED_OUTPATIENT_CLINIC_OR_DEPARTMENT_OTHER)
Admission: RE | Disposition: A | Discharge status: Home / Self Care | Payer: Self-pay | Source: Ambulatory Visit | Attending: General Surgery

## 2016-12-11 ENCOUNTER — Ambulatory Visit
Admission: RE | Admit: 2016-12-11 | Discharge: 2016-12-11 | Disposition: A | Discharge status: Home / Self Care | Payer: BLUE CROSS/BLUE SHIELD | Source: Ambulatory Visit | Attending: General Surgery | Admitting: General Surgery

## 2016-12-11 DIAGNOSIS — N6022 Fibroadenosis of left breast: Secondary | ICD-10-CM | POA: Diagnosis not present

## 2016-12-11 DIAGNOSIS — N6092 Unspecified benign mammary dysplasia of left breast: Secondary | ICD-10-CM | POA: Insufficient documentation

## 2016-12-11 DIAGNOSIS — Z801 Family history of malignant neoplasm of trachea, bronchus and lung: Secondary | ICD-10-CM | POA: Insufficient documentation

## 2016-12-11 DIAGNOSIS — M5136 Other intervertebral disc degeneration, lumbar region: Secondary | ICD-10-CM | POA: Diagnosis not present

## 2016-12-11 DIAGNOSIS — R918 Other nonspecific abnormal finding of lung field: Secondary | ICD-10-CM | POA: Insufficient documentation

## 2016-12-11 DIAGNOSIS — R928 Other abnormal and inconclusive findings on diagnostic imaging of breast: Secondary | ICD-10-CM

## 2016-12-11 DIAGNOSIS — F329 Major depressive disorder, single episode, unspecified: Secondary | ICD-10-CM | POA: Insufficient documentation

## 2016-12-11 DIAGNOSIS — I739 Peripheral vascular disease, unspecified: Secondary | ICD-10-CM | POA: Diagnosis not present

## 2016-12-11 DIAGNOSIS — K219 Gastro-esophageal reflux disease without esophagitis: Secondary | ICD-10-CM | POA: Insufficient documentation

## 2016-12-11 DIAGNOSIS — Z79899 Other long term (current) drug therapy: Secondary | ICD-10-CM | POA: Diagnosis not present

## 2016-12-11 DIAGNOSIS — F419 Anxiety disorder, unspecified: Secondary | ICD-10-CM | POA: Diagnosis not present

## 2016-12-11 DIAGNOSIS — J449 Chronic obstructive pulmonary disease, unspecified: Secondary | ICD-10-CM | POA: Diagnosis not present

## 2016-12-11 DIAGNOSIS — Z803 Family history of malignant neoplasm of breast: Secondary | ICD-10-CM | POA: Insufficient documentation

## 2016-12-11 DIAGNOSIS — I10 Essential (primary) hypertension: Secondary | ICD-10-CM | POA: Diagnosis not present

## 2016-12-11 DIAGNOSIS — Z8051 Family history of malignant neoplasm of kidney: Secondary | ICD-10-CM | POA: Insufficient documentation

## 2016-12-11 DIAGNOSIS — M797 Fibromyalgia: Secondary | ICD-10-CM | POA: Insufficient documentation

## 2016-12-11 DIAGNOSIS — N6489 Other specified disorders of breast: Secondary | ICD-10-CM | POA: Diagnosis present

## 2016-12-11 HISTORY — DX: Other abnormal and inconclusive findings on diagnostic imaging of breast: R92.8

## 2016-12-11 HISTORY — PX: BREAST LUMPECTOMY WITH RADIOACTIVE SEED LOCALIZATION: SHX6424

## 2016-12-11 SURGERY — BREAST LUMPECTOMY WITH RADIOACTIVE SEED LOCALIZATION
Anesthesia: General | Site: Breast | Laterality: Left

## 2016-12-11 MED ORDER — FENTANYL CITRATE (PF) 100 MCG/2ML IJ SOLN
50.0000 ug | INTRAMUSCULAR | Status: DC | PRN
  Administered 2016-12-11: 100 ug via INTRAVENOUS

## 2016-12-11 MED ORDER — SCOPOLAMINE 1 MG/3DAYS TD PT72: 1.0000 | MEDICATED_PATCH | Freq: Once | TRANSDERMAL | Status: DC | PRN

## 2016-12-11 MED ORDER — ACETAMINOPHEN 500 MG PO TABS
1000.0000 mg | ORAL_TABLET | ORAL | Status: AC
  Administered 2016-12-11: 1000 mg via ORAL

## 2016-12-11 MED ORDER — MIDAZOLAM HCL 2 MG/2ML IJ SOLN
INTRAMUSCULAR | Status: AC
  Filled 2016-12-11: qty 2

## 2016-12-11 MED ORDER — CEFAZOLIN SODIUM-DEXTROSE 2-4 GM/100ML-% IV SOLN
INTRAVENOUS | Status: AC
  Filled 2016-12-11: qty 100

## 2016-12-11 MED ORDER — ONDANSETRON HCL 4 MG/2ML IJ SOLN
INTRAMUSCULAR | Status: DC | PRN
  Administered 2016-12-11: 4 mg via INTRAVENOUS

## 2016-12-11 MED ORDER — ONDANSETRON HCL 4 MG/2ML IJ SOLN
INTRAMUSCULAR | Status: AC
  Filled 2016-12-11: qty 2

## 2016-12-11 MED ORDER — OXYCODONE HCL 5 MG/5ML PO SOLN: 5.0000 mg | Freq: Once | ORAL | Status: DC | PRN

## 2016-12-11 MED ORDER — MIDAZOLAM HCL 2 MG/2ML IJ SOLN
1.0000 mg | INTRAMUSCULAR | Status: DC | PRN
  Administered 2016-12-11: 2 mg via INTRAVENOUS

## 2016-12-11 MED ORDER — PROPOFOL 10 MG/ML IV BOLUS
INTRAVENOUS | Status: DC | PRN
  Administered 2016-12-11: 170 mg via INTRAVENOUS

## 2016-12-11 MED ORDER — CHLORHEXIDINE GLUCONATE CLOTH 2 % EX PADS: 6.0000 | MEDICATED_PAD | Freq: Once | CUTANEOUS | Status: DC

## 2016-12-11 MED ORDER — GABAPENTIN 300 MG PO CAPS
ORAL_CAPSULE | ORAL | Status: AC
  Filled 2016-12-11: qty 1

## 2016-12-11 MED ORDER — GABAPENTIN 300 MG PO CAPS
300.0000 mg | ORAL_CAPSULE | ORAL | Status: AC
  Administered 2016-12-11: 300 mg via ORAL

## 2016-12-11 MED ORDER — OXYCODONE HCL 5 MG PO TABS: 5.0000 mg | ORAL_TABLET | Freq: Once | ORAL | Status: DC | PRN

## 2016-12-11 MED ORDER — HYDROCODONE-ACETAMINOPHEN 5-325 MG PO TABS: 1.0000 | ORAL_TABLET | Freq: Four times a day (QID) | ORAL | 0 refills | Status: AC | PRN

## 2016-12-11 MED ORDER — HYDROMORPHONE HCL 1 MG/ML IJ SOLN
INTRAMUSCULAR | Status: AC
Start: 2016-12-11 — End: ?
  Filled 2016-12-11: qty 0.5

## 2016-12-11 MED ORDER — ACETAMINOPHEN 500 MG PO TABS
ORAL_TABLET | ORAL | Status: AC
  Filled 2016-12-11: qty 2

## 2016-12-11 MED ORDER — HYDROMORPHONE HCL 1 MG/ML IJ SOLN
0.2500 mg | INTRAMUSCULAR | Status: DC | PRN
  Administered 2016-12-11: 0.5 mg via INTRAVENOUS

## 2016-12-11 MED ORDER — LACTATED RINGERS IV SOLN
INTRAVENOUS | Status: DC
  Administered 2016-12-11: 10:00:00 via INTRAVENOUS

## 2016-12-11 MED ORDER — BUPIVACAINE-EPINEPHRINE (PF) 0.5% -1:200000 IJ SOLN
INTRAMUSCULAR | Status: AC
  Filled 2016-12-11: qty 30

## 2016-12-11 MED ORDER — PROPOFOL 10 MG/ML IV BOLUS
INTRAVENOUS | Status: AC
  Filled 2016-12-11: qty 40

## 2016-12-11 MED ORDER — LIDOCAINE 2% (20 MG/ML) 5 ML SYRINGE
INTRAMUSCULAR | Status: AC
  Filled 2016-12-11: qty 5

## 2016-12-11 MED ORDER — PROMETHAZINE HCL 25 MG/ML IJ SOLN: 6.2500 mg | INTRAMUSCULAR | Status: DC | PRN

## 2016-12-11 MED ORDER — CELECOXIB 400 MG PO CAPS
400.0000 mg | ORAL_CAPSULE | ORAL | Status: AC
Start: 2016-12-11 — End: 2016-12-11
  Administered 2016-12-11: 400 mg via ORAL

## 2016-12-11 MED ORDER — MEPERIDINE HCL 25 MG/ML IJ SOLN: 6.2500 mg | INTRAMUSCULAR | Status: DC | PRN

## 2016-12-11 MED ORDER — BUPIVACAINE-EPINEPHRINE (PF) 0.5% -1:200000 IJ SOLN
INTRAMUSCULAR | Status: DC | PRN
  Administered 2016-12-11: 8 mL

## 2016-12-11 MED ORDER — LACTATED RINGERS IV SOLN: INTRAVENOUS | Status: DC

## 2016-12-11 MED ORDER — FENTANYL CITRATE (PF) 100 MCG/2ML IJ SOLN
INTRAMUSCULAR | Status: AC
  Filled 2016-12-11: qty 2

## 2016-12-11 MED ORDER — CEFAZOLIN SODIUM-DEXTROSE 2-4 GM/100ML-% IV SOLN
2.0000 g | INTRAVENOUS | Status: AC
  Administered 2016-12-11: 2 g via INTRAVENOUS

## 2016-12-11 MED ORDER — CELECOXIB 200 MG PO CAPS
ORAL_CAPSULE | ORAL | Status: AC
  Filled 2016-12-11: qty 2

## 2016-12-11 MED ORDER — LIDOCAINE 2% (20 MG/ML) 5 ML SYRINGE
INTRAMUSCULAR | Status: DC | PRN
  Administered 2016-12-11: 60 mg via INTRAVENOUS

## 2016-12-11 MED ORDER — DEXAMETHASONE SODIUM PHOSPHATE 10 MG/ML IJ SOLN
INTRAMUSCULAR | Status: AC
  Filled 2016-12-11: qty 1

## 2016-12-11 SURGICAL SUPPLY — 61 items
APPLIER CLIP 9.375 MED OPEN (MISCELLANEOUS) ×2
BENZOIN TINCTURE PRP APPL 2/3 (GAUZE/BANDAGES/DRESSINGS) IMPLANT
BINDER BREAST LRG (GAUZE/BANDAGES/DRESSINGS) IMPLANT
BINDER BREAST MEDIUM (GAUZE/BANDAGES/DRESSINGS) IMPLANT
BINDER BREAST XLRG (GAUZE/BANDAGES/DRESSINGS) ×2 IMPLANT
BINDER BREAST XXLRG (GAUZE/BANDAGES/DRESSINGS) IMPLANT
BLADE HEX COATED 2.75 (ELECTRODE) ×2 IMPLANT
BLADE SURG 10 STRL SS (BLADE) IMPLANT
BLADE SURG 15 STRL LF DISP TIS (BLADE) ×1 IMPLANT
BLADE SURG 15 STRL SS (BLADE) ×1
CANISTER SUC SOCK COL 7IN (MISCELLANEOUS) IMPLANT
CANISTER SUCT 1200ML W/VALVE (MISCELLANEOUS) ×2 IMPLANT
CHLORAPREP W/TINT 26ML (MISCELLANEOUS) ×2 IMPLANT
CLIP APPLIE 9.375 MED OPEN (MISCELLANEOUS) ×1 IMPLANT
COVER BACK TABLE 60X90IN (DRAPES) ×2 IMPLANT
COVER MAYO STAND STRL (DRAPES) ×2 IMPLANT
COVER PROBE W GEL 5X96 (DRAPES) ×2 IMPLANT
DECANTER SPIKE VIAL GLASS SM (MISCELLANEOUS) IMPLANT
DERMABOND ADVANCED (GAUZE/BANDAGES/DRESSINGS) ×1
DERMABOND ADVANCED .7 DNX12 (GAUZE/BANDAGES/DRESSINGS) ×1 IMPLANT
DEVICE DUBIN W/COMP PLATE 8390 (MISCELLANEOUS) ×2 IMPLANT
DRAPE LAPAROSCOPIC ABDOMINAL (DRAPES) ×2 IMPLANT
DRAPE UTILITY XL STRL (DRAPES) ×2 IMPLANT
DRSG PAD ABDOMINAL 8X10 ST (GAUZE/BANDAGES/DRESSINGS) ×2 IMPLANT
ELECT REM PT RETURN 9FT ADLT (ELECTROSURGICAL) ×2
ELECTRODE REM PT RTRN 9FT ADLT (ELECTROSURGICAL) ×1 IMPLANT
GAUZE SPONGE 4X4 12PLY STRL LF (GAUZE/BANDAGES/DRESSINGS) IMPLANT
GLOVE BIO SURGEON STRL SZ 6.5 (GLOVE) ×2 IMPLANT
GLOVE BIOGEL PI IND STRL 6.5 (GLOVE) ×1 IMPLANT
GLOVE BIOGEL PI INDICATOR 6.5 (GLOVE) ×1
GLOVE EUDERMIC 7 POWDERFREE (GLOVE) ×2 IMPLANT
GLOVE EXAM NITRILE MD LF STRL (GLOVE) ×2 IMPLANT
GOWN STRL REUS W/ TWL LRG LVL3 (GOWN DISPOSABLE) ×1 IMPLANT
GOWN STRL REUS W/ TWL XL LVL3 (GOWN DISPOSABLE) ×1 IMPLANT
GOWN STRL REUS W/TWL LRG LVL3 (GOWN DISPOSABLE) ×1
GOWN STRL REUS W/TWL XL LVL3 (GOWN DISPOSABLE) ×1
ILLUMINATOR WAVEGUIDE N/F (MISCELLANEOUS) IMPLANT
KIT MARKER MARGIN INK (KITS) ×2 IMPLANT
LIGHT WAVEGUIDE WIDE FLAT (MISCELLANEOUS) IMPLANT
NEEDLE HYPO 25X1 1.5 SAFETY (NEEDLE) ×2 IMPLANT
NS IRRIG 1000ML POUR BTL (IV SOLUTION) ×2 IMPLANT
PACK BASIN DAY SURGERY FS (CUSTOM PROCEDURE TRAY) ×2 IMPLANT
PENCIL BUTTON HOLSTER BLD 10FT (ELECTRODE) ×2 IMPLANT
SHEET MEDIUM DRAPE 40X70 STRL (DRAPES) IMPLANT
SLEEVE SCD COMPRESS KNEE MED (MISCELLANEOUS) ×2 IMPLANT
SPONGE LAP 18X18 X RAY DECT (DISPOSABLE) IMPLANT
SPONGE LAP 4X18 X RAY DECT (DISPOSABLE) ×2 IMPLANT
STRIP CLOSURE SKIN 1/2X4 (GAUZE/BANDAGES/DRESSINGS) IMPLANT
SUT ETHILON 3 0 FSL (SUTURE) IMPLANT
SUT MNCRL AB 4-0 PS2 18 (SUTURE) ×2 IMPLANT
SUT SILK 2 0 SH (SUTURE) ×2 IMPLANT
SUT VIC AB 2-0 CT1 27 (SUTURE)
SUT VIC AB 2-0 CT1 TAPERPNT 27 (SUTURE) IMPLANT
SUT VIC AB 3-0 SH 27 (SUTURE)
SUT VIC AB 3-0 SH 27X BRD (SUTURE) IMPLANT
SUT VICRYL 3-0 CR8 SH (SUTURE) ×2 IMPLANT
SYR 10ML LL (SYRINGE) ×2 IMPLANT
TOWEL OR 17X24 6PK STRL BLUE (TOWEL DISPOSABLE) ×2 IMPLANT
TOWEL OR NON WOVEN STRL DISP B (DISPOSABLE) ×2 IMPLANT
TUBE CONNECTING 20X1/4 (TUBING) ×2 IMPLANT
YANKAUER SUCT BULB TIP NO VENT (SUCTIONS) ×2 IMPLANT

## 2016-12-11 NOTE — Transfer of Care (Signed)
Immediate Anesthesia Transfer of Care Note  Patient: Vanessa Mayer  Procedure(s) Performed: LEFT BREAST LUMPECTOMY WITH RADIOACTIVE SEED LOCALIZATION (Left Breast)  Patient Location: PACU  Anesthesia Type:General  Level of Consciousness: sedated  Airway & Oxygen Therapy: Patient Spontanous Breathing and Patient connected to face mask oxygen  Post-op Assessment: Report given to RN and Post -op Vital signs reviewed and stable  Post vital signs: Reviewed and stable  Last Vitals:  Vitals:   12/11/16 1101 12/11/16 1102  BP:    Pulse: 78 75  Resp: 10 15  Temp:    SpO2: 100% 100%    Last Pain:  Vitals:   12/11/16 0929  TempSrc: Oral         Complications: No apparent anesthesia complications

## 2016-12-11 NOTE — Op Note (Signed)
Patient Name:           Vanessa Mayer   Date of Surgery:        12/11/2016  Pre op Diagnosis:      Complex sclerosing lesion left breast  Post op Diagnosis:    Same  Procedure:                 Left breast lumpectomy with radioactive seed localization  Surgeon:                     Edsel Petrin. Dalbert Batman, M.D., FACS  Assistant:                      OR staff   Indication for Assistant: N/A  Operative Indications:     This is a 57 year old female from [facility], referred by Dr. Rosana Hoes at the [facility] for evaluation of complex sclerosing lesion left breast. Dr. Valaria Good in [place]. Also sees Dr. Celedonio Miyamoto at [facility] in [place].  Gets annual mammograms. Recent mammogram show a focal area of architectural distortion in the upper outer quadrant of the left breast. Image guided biopsy shows complex sclerosing lesion, fibrocystic change and usual ductal hyperplasia. She was referred for excision. We had a long talk. Exam shows no mass. I told her that she probably does not have cancer but there is a small 4-9% chance that she could have cancer at this time. She clearly was to have this area excised. Father died of lung cancer. Mother died of kidney cancer. Paternal grandmother had breast cancer.     She'll be scheduled for left breast lumpectomy with radioactive seed localization.She agrees with this plan.  Operative Findings:       The reactive seed marker clip were in the center of the breast, retroareolar area, middle depth.  This allowed a circumareolar, hidden scar technique.  The specimen mammogram looked good with the scene and marker clip in the center of the specimen.  Procedure in Detail:          Following the induction of general LMA anesthesia the patient's left breast was prepped and draped in a sterile fashion.  Surgical timeout was performed.  Intravenous antibiotic were given.  0.5% Marcaine with epinephrine was used as local infiltration anesthetic.     Using the neoprobe I localized the seed.  I chose to make a superior lateral circumareolar incision at the areolar margin.  The lumpectomy was performed with the neoprobe and the electrocautery.  The specimen was removed and marked with silk sutures and a 6 color ink kit to orient the pathologist.  The specimen mammogram looked good as mentioned above.  The specimen was marked and sent to the lab where the seed was retrieved.  Hemostasis was excellent and achieved with electrocautery.  After irrigating the wound I marked the walls of the lumpectomy cavity with 5 metal clips.  The breast tissues were closed with interrupted 3-0 Vicryl and the skin closed with a running subcuticular 4-0 Monocryl and Dermabond.  Breast binder was placed and the patient taken to PACU in stable condition.  EBL 20 mL or less.  Counts correct.  Complications none.     Edsel Petrin. Dalbert Batman, M.D., FACS General and Minimally Invasive Surgery Breast and Colorectal Surgery   Addendum: I logged onto the [facility] website and reviewed her prescription medication history  12/11/2016 10:56 AM

## 2016-12-11 NOTE — Anesthesia Preprocedure Evaluation (Signed)
Anesthesia Evaluation    Reviewed: Allergy & Precautions, Patient's Chart, lab work & pertinent test results  Airway Mallampati: I  TM Distance: >3 FB Neck ROM: Full    Dental  (+) Teeth Intact, Dental Advisory Given, Chipped,    Pulmonary COPD,    breath sounds clear to auscultation       Cardiovascular + Peripheral Vascular Disease  negative cardio ROS   Rhythm:Regular Rate:Normal     Neuro/Psych  Headaches, PSYCHIATRIC DISORDERS Anxiety Depression  Neuromuscular disease    GI/Hepatic Neg liver ROS, GERD  ,  Endo/Other  negative endocrine ROS  Renal/GU Renal disease     Musculoskeletal negative musculoskeletal ROS (+) Arthritis , Fibromyalgia -  Abdominal   Peds  Hematology negative hematology ROS (+)   Anesthesia Other Findings Day of surgery medications reviewed with the patient.  Reproductive/Obstetrics                             Anesthesia Physical Anesthesia Plan  ASA: II  Anesthesia Plan: General   Post-op Pain Management:    Induction: Intravenous  PONV Risk Score and Plan: 4 or greater and Ondansetron, Dexamethasone, Midazolam, Scopolamine patch - Pre-op and Treatment may vary due to age or medical condition  Airway Management Planned: LMA  Additional Equipment:   Intra-op Plan:   Post-operative Plan: Extubation in OR  Informed Consent: I have reviewed the patients History and Physical, chart, labs and discussed the procedure including the risks, benefits and alternatives for the proposed anesthesia with the patient or authorized representative who has indicated his/her understanding and acceptance.   Dental advisory given  Plan Discussed with: CRNA  Anesthesia Plan Comments:         Anesthesia Quick Evaluation

## 2016-12-11 NOTE — Anesthesia Procedure Notes (Signed)
Procedure Name: LMA Insertion Date/Time: 12/11/2016 10:16 AM Performed by: Lieutenant Diego Pre-anesthesia Checklist: Patient identified, Emergency Drugs available, Suction available and Patient being monitored Patient Re-evaluated:Patient Re-evaluated prior to induction Oxygen Delivery Method: Circle system utilized Preoxygenation: Pre-oxygenation with 100% oxygen Induction Type: IV induction Ventilation: Mask ventilation without difficulty LMA: LMA inserted LMA Size: 4.0 Number of attempts: 1 Airway Equipment and Method: Bite block Placement Confirmation: positive ETCO2 and breath sounds checked- equal and bilateral Tube secured with: Tape Dental Injury: Teeth and Oropharynx as per pre-operative assessment

## 2016-12-11 NOTE — Anesthesia Postprocedure Evaluation (Signed)
Anesthesia Post Note  Patient: Vanessa Mayer  Procedure(s) Performed: LEFT BREAST LUMPECTOMY WITH RADIOACTIVE SEED LOCALIZATION (Left Breast)     Patient location during evaluation: PACU Anesthesia Type: General Level of consciousness: awake and alert Pain management: pain level controlled Vital Signs Assessment: post-procedure vital signs reviewed and stable Respiratory status: spontaneous breathing, nonlabored ventilation, respiratory function stable and patient connected to nasal cannula oxygen Cardiovascular status: blood pressure returned to baseline and stable Postop Assessment: no apparent nausea or vomiting Anesthetic complications: no    Last Vitals:  Vitals:   12/11/16 1130 12/11/16 1145  BP:  (!) 117/57  Pulse: 77 81  Resp: 18 16  Temp:  (!) 36.4 C  SpO2: 96% 97%    Last Pain:  Vitals:   12/11/16 1145  TempSrc:   PainSc: [place] Amedeo Detweiler

## 2016-12-11 NOTE — Interval H&P Note (Signed)
History and Physical Interval Note:  12/11/2016 9:40 AM  Vanessa Mayer  has presented today for surgery, with the diagnosis of LEFT BREAST COMPLEX SCLEROSING LESION  The various methods of treatment have been discussed with the patient and family. After consideration of risks, benefits and other options for treatment, the patient has consented to  Procedure(s): BREAST LUMPECTOMY WITH RADIOACTIVE SEED LOCALIZATION (Left) as a surgical intervention .  The patient's history has been reviewed, patient examined, no change in status, stable for surgery.  I have reviewed the patient's chart and labs.  Questions were answered to the patient's satisfaction.     Adin Hector

## 2016-12-11 NOTE — Discharge Instructions (Signed)
[facility] °Office Phone Number [phone] ° °BREAST BIOPSY/ PARTIAL MASTECTOMY: POST OP INSTRUCTIONS ° °Always review your discharge instruction sheet given to you by the facility where your surgery was performed. ° °IF YOU HAVE DISABILITY OR FAMILY LEAVE FORMS, YOU MUST BRING THEM TO THE OFFICE FOR PROCESSING.  DO NOT GIVE THEM TO YOUR DOCTOR. ° °1. A prescription for pain medication may be given to you upon discharge.  Take your pain medication as prescribed, if needed.  If narcotic pain medicine is not needed, then you may take acetaminophen (Tylenol) or ibuprofen (Advil) as needed. °2. Take your usually prescribed medications unless otherwise directed °3. If you need a refill on your pain medication, please contact your pharmacy.  They will contact our office to request authorization.  Prescriptions will not be filled after 5pm or on week-ends. °4. You should eat very light the first 24 hours after surgery, such as soup, crackers, pudding, etc.  Resume your normal diet the day after surgery. °5. Most patients will experience some swelling and bruising in the breast.  Ice packs and a good support bra will help.  Swelling and bruising can take several days to resolve.  °6. It is common to experience some constipation if taking pain medication after surgery.  Increasing fluid intake and taking a stool softener will usually help or prevent this problem from occurring.  A mild laxative (Milk of Magnesia or Miralax) should be taken according to package directions if there are no bowel movements after 48 hours. °7. Unless discharge instructions indicate otherwise, you may remove your bandages 24-48 hours after surgery, and you may shower at that time.  You may have steri-strips (small skin tapes) in place directly over the incision.  These strips should be left on the skin for 7-10 days.  If your surgeon used skin glue on the incision, you may shower in 24 hours.  The glue will flake off over the  next 2-3 weeks.  Any sutures or staples will be removed at the office during your follow-up visit. °8. ACTIVITIES:  You may resume regular daily activities (gradually increasing) beginning the next day.  Wearing a good support bra or sports bra minimizes pain and swelling.  You may have sexual intercourse when it is comfortable. °a. You may drive when you no longer are taking prescription pain medication, you can comfortably wear a seatbelt, and you can safely maneuver your car and apply brakes. °b. RETURN TO WORK:  ______________________________________________________________________________________ °9. You should see your doctor in the office for a follow-up appointment approximately two weeks after your surgery.  Your doctor’s nurse will typically make your follow-up appointment when she calls you with your pathology report.  Expect your pathology report 2-3 business days after your surgery.  You may call to check if you do not hear from us after three days. °10. OTHER INSTRUCTIONS: _______________________________________________________________________________________________ _____________________________________________________________________________________________________________________________________ °_____________________________________________________________________________________________________________________________________ °_____________________________________________________________________________________________________________________________________ ° °WHEN TO CALL YOUR DOCTOR: °1. Fever over 101.0 °2. Nausea and/or vomiting. °3. Extreme swelling or bruising. °4. Continued bleeding from incision. °5. Increased pain, redness, or drainage from the incision. ° °The clinic staff is available to answer your questions during regular business hours.  Please don’t hesitate to call and ask to speak to one of the nurses for clinical concerns.  If you have a medical emergency, go to the nearest  emergency room or call 911.  A surgeon from [facility] is always on call at the hospital. ° °For further questions, please visit [email or web address]  ° ° ° ° °  Post Anesthesia Home Care Instructions ° °Activity: °Get plenty of rest for the remainder of the day. A responsible individual must stay with you for 24 hours following the procedure.  °For the next 24 hours, DO NOT: °-Drive a car °-Operate machinery °-Drink alcoholic beverages °-Take any medication unless instructed by your physician °-Make any legal decisions or sign important papers. ° °Meals: °Start with liquid foods such as gelatin or soup. Progress to regular foods as tolerated. Avoid greasy, spicy, heavy foods. If nausea and/or vomiting occur, drink only clear liquids until the nausea and/or vomiting subsides. Call your physician if vomiting continues. ° °Special Instructions/Symptoms: °Your throat may feel dry or sore from the anesthesia or the breathing tube placed in your throat during surgery. If this causes discomfort, gargle with warm salt water. The discomfort should disappear within 24 hours. ° °If you had a scopolamine patch placed behind your ear for the management of post- operative nausea and/or vomiting: ° °1. The medication in the patch is effective for 72 hours, after which it should be removed.  Wrap patch in a tissue and discard in the trash. Wash hands thoroughly with soap and water. °2. You may remove the patch earlier than 72 hours if you experience unpleasant side effects which may include dry mouth, dizziness or visual disturbances. °3. Avoid touching the patch. Wash your hands with soap and water after contact with the patch. °  ° °

## 2016-12-12 ENCOUNTER — Encounter (HOSPITAL_BASED_OUTPATIENT_CLINIC_OR_DEPARTMENT_OTHER): Payer: Self-pay | Admitting: General Surgery

## 2016-12-12 NOTE — Progress Notes (Signed)
Inform patient of Pathology report,.  Tell her that her breast biopsy showed no cancer.  Complex sclerosing lesion and hyperplasia.  No malignancy.  I will discuss with her in detail when I see her in the office.  Me know that you contacted her.  Thanks.

## 2018-04-04 IMAGING — MG BREAST SURGICAL SPECIMEN
1 series · 1 of 1 positions shown · non-contrast
Comparison: Previous exam(s).

CLINICAL DATA: Patient status post left breast surgical excision.

EXAM:
SPECIMEN RADIOGRAPH OF THE LEFT BREAST

[L]
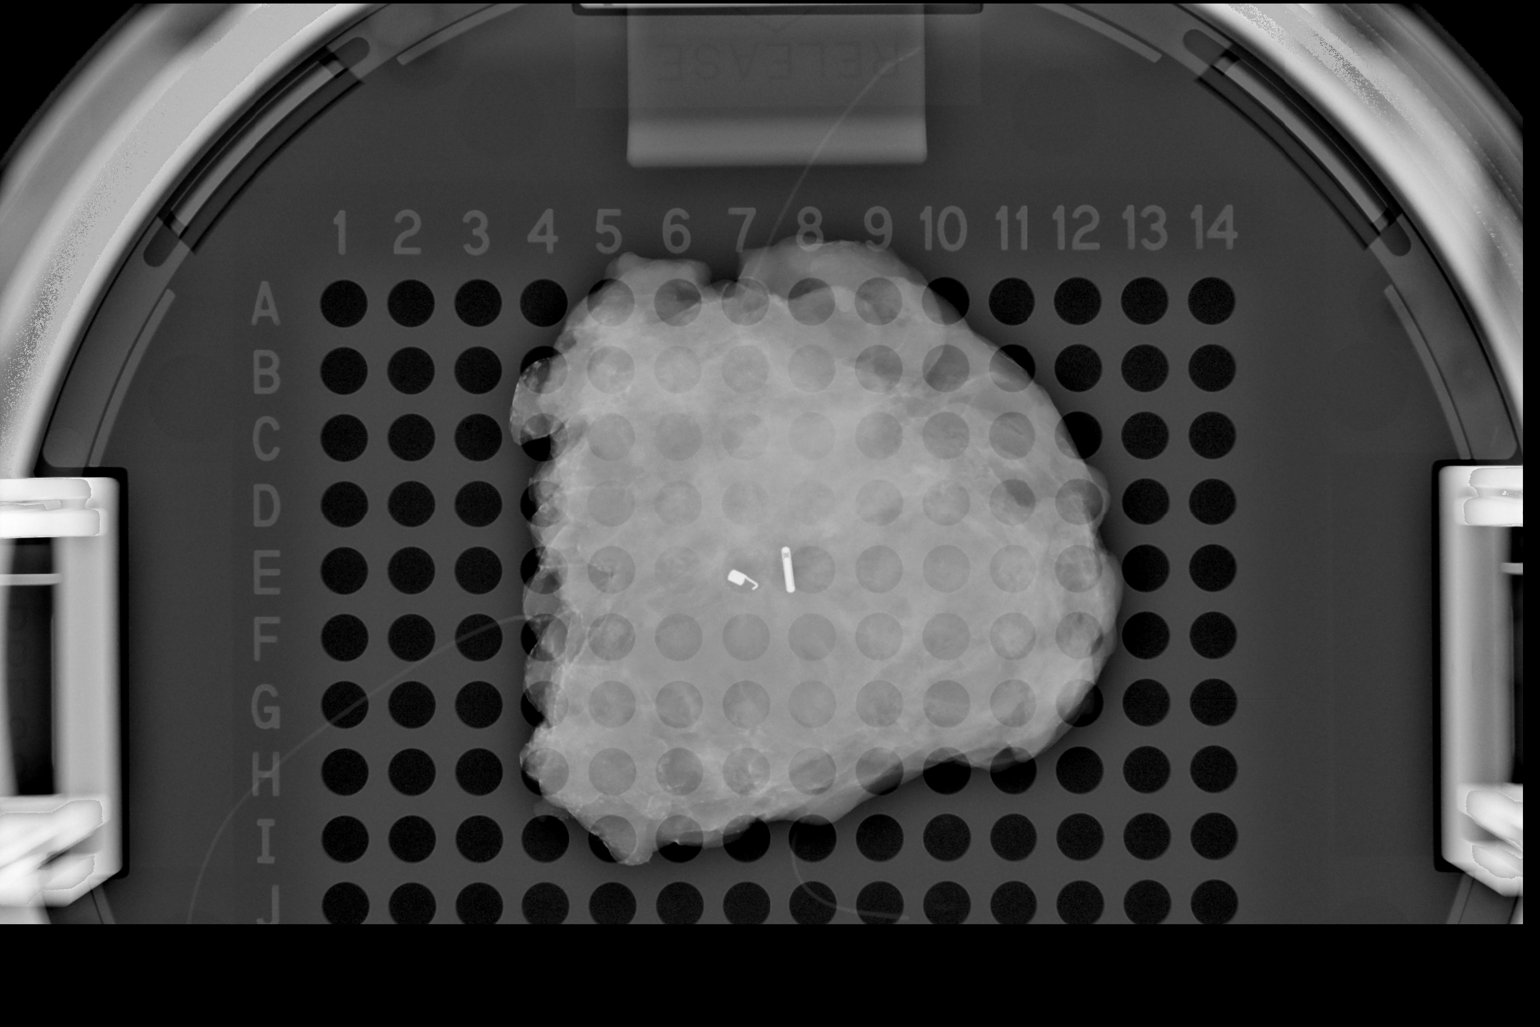

[1 of 1 positions shown; findings below may reference images not displayed]

FINDINGS: Status post excision of the left breast. The radioactive seed and
biopsy marker clip are present, completely intact, and were marked
for pathology.
IMPRESSION: Specimen radiograph of the left breast.

## 2018-08-01 DIAGNOSIS — R079 Chest pain, unspecified: Secondary | ICD-10-CM | POA: Diagnosis not present

## 2018-08-01 DIAGNOSIS — I160 Hypertensive urgency: Secondary | ICD-10-CM

## 2018-08-01 DIAGNOSIS — R739 Hyperglycemia, unspecified: Secondary | ICD-10-CM | POA: Diagnosis not present

## 2018-08-01 DIAGNOSIS — D72829 Elevated white blood cell count, unspecified: Secondary | ICD-10-CM | POA: Diagnosis not present

## 2018-08-02 DIAGNOSIS — R0789 Other chest pain: Secondary | ICD-10-CM | POA: Diagnosis not present

## 2018-08-02 DIAGNOSIS — R079 Chest pain, unspecified: Secondary | ICD-10-CM

## 2023-02-11 ENCOUNTER — Ambulatory Visit: Payer: Medicaid Other | Admitting: Podiatry

## 2023-02-11 ENCOUNTER — Encounter: Payer: Self-pay | Admitting: Podiatry

## 2023-02-11 DIAGNOSIS — M79674 Pain in right toe(s): Secondary | ICD-10-CM

## 2023-02-11 DIAGNOSIS — M79675 Pain in left toe(s): Secondary | ICD-10-CM | POA: Diagnosis not present

## 2023-02-11 DIAGNOSIS — B351 Tinea unguium: Secondary | ICD-10-CM | POA: Diagnosis not present

## 2023-02-11 DIAGNOSIS — L84 Corns and callosities: Secondary | ICD-10-CM | POA: Diagnosis not present

## 2023-02-11 NOTE — Progress Notes (Signed)
  Subjective:  Patient ID: Vanessa Mayer, female    DOB: 1960/09/15,  MRN: [id number]  Chief Complaint  Patient presents with   Toe Pain    Both great toes painful toenails, numbess at end of toes.  Trim nails. Borderline DM - not on medicine,  has issues with circulation    62 y.o. female presents with the above complaint. History confirmed with patient.  States that bilateral great toenails have become thickened and are painful with shoe gear.  She notes some color changes as well.  She has tried filing them out at home herself.  She also reports some numbness and callus to the ends of the toes.  Does report history of circulation issues, this appears to be related to venous problems primarily.  Does report prediabetes.  Patient denies any nausea, vomiting fever, chills, chest pain, shortness of breath.  Objective:  Physical Exam: warm, good capillary refill, nail exam normal nails without lesions and nail dystrophy present to the bilateral great toenails with thickening, discoloration, findings consistent for mycotic infection, some incubating present to the right hallux medial border.  Preulcerative callus present to the bilateral distal hallux left worse than right. DP pulses palpable and protective sensation intact, PT pulses faintly palpable. Left Foot: normal exam, no swelling, tenderness, instability; ligaments intact, full range of motion of all ankle/foot joints  Right Foot: normal exam, no swelling, tenderness, instability; ligaments intact, full range of motion of all ankle/foot joints   No images are attached to the encounter.  Assessment:   1. Pain due to onychomycosis of toenails of both feet   2. Pre-ulcerative calluses      Plan:  Patient was evaluated and treated and all questions answered.  Onychomycosis bilateral great toenails Preulcerative callus bilateral great toes  -Affected toenails x 2 were palliatively debrided in thickness and length using aseptic nail  nippers, mechanical bur was used to file down the nails -Preulcerative callus was pared down at the distal end of the great toes associated with the nail plate using 15 blade without incident.  Atrophic skin appreciated to the left hallux, this was dressed with antibiotic ointment and bandaid. -Instructed nightly application of Vicks VapoRub to soften nails, this may help her tolerate shoes -Discussed whitening or scrubs prior to showering in an effort to maintain the callused areas -Did discuss removal of the involved toenails, do feel she has adequate circulation for this if needed and that her primary issues are venous in nature.  Instructed patient to return as needed if she would like total nail avulsion performed  Return if symptoms worsen or fail to improve, for Onychomycosis/nail dystrophy.
